# Patient Record
Sex: Male | Born: 1975 | Race: White | Hispanic: No | Smoking: Never smoker
Health system: Southern US, Community
[De-identification: ages and names within clinical notes are randomized; demographics above are authoritative.]

## PROBLEM LIST (undated history)

## (undated) DIAGNOSIS — F988 Other specified behavioral and emotional disorders with onset usually occurring in childhood and adolescence: Secondary | ICD-10-CM

## (undated) DIAGNOSIS — F419 Anxiety disorder, unspecified: Secondary | ICD-10-CM

## (undated) DIAGNOSIS — E559 Vitamin D deficiency, unspecified: Secondary | ICD-10-CM

## (undated) DIAGNOSIS — E069 Thyroiditis, unspecified: Secondary | ICD-10-CM

## (undated) DIAGNOSIS — E291 Testicular hypofunction: Secondary | ICD-10-CM

## (undated) DIAGNOSIS — I10 Essential (primary) hypertension: Secondary | ICD-10-CM

## (undated) HISTORY — DX: Anxiety disorder, unspecified: F41.9

## (undated) HISTORY — DX: Other specified behavioral and emotional disorders with onset usually occurring in childhood and adolescence: F98.8

## (undated) HISTORY — DX: Vitamin D deficiency, unspecified: E55.9

## (undated) HISTORY — DX: Testicular hypofunction: E29.1

## (undated) HISTORY — DX: Thyroiditis, unspecified: E06.9

## (undated) HISTORY — DX: Essential (primary) hypertension: I10

---

## 1998-05-25 ENCOUNTER — Emergency Department (HOSPITAL_COMMUNITY): Admission: EM | Admit: 1998-05-25 | Discharge: 1998-05-25 | Payer: Self-pay | Admitting: Emergency Medicine

## 2001-05-12 ENCOUNTER — Emergency Department (HOSPITAL_COMMUNITY): Admission: EM | Admit: 2001-05-12 | Discharge: 2001-05-12 | Payer: Self-pay | Admitting: Emergency Medicine

## 2009-07-19 ENCOUNTER — Encounter: Payer: Self-pay | Admitting: Family Medicine

## 2013-06-03 ENCOUNTER — Encounter: Payer: Self-pay | Admitting: Physician Assistant

## 2013-06-03 ENCOUNTER — Ambulatory Visit: Payer: Self-pay | Admitting: Emergency Medicine

## 2013-06-03 ENCOUNTER — Ambulatory Visit: Payer: Self-pay | Admitting: Physician Assistant

## 2013-06-03 VITALS — BP 130/80 | HR 60 | Temp 98.6°F | Resp 16 | Wt 253.0 lb

## 2013-06-03 DIAGNOSIS — F988 Other specified behavioral and emotional disorders with onset usually occurring in childhood and adolescence: Secondary | ICD-10-CM | POA: Insufficient documentation

## 2013-06-03 DIAGNOSIS — J019 Acute sinusitis, unspecified: Secondary | ICD-10-CM

## 2013-06-03 MED ORDER — AMPHETAMINE-DEXTROAMPHETAMINE 20 MG PO TABS: 20.0000 mg | ORAL_TABLET | Freq: Two times a day (BID) | ORAL | Status: DC

## 2013-06-03 MED ORDER — SULFAMETHOXAZOLE-TMP DS 800-160 MG PO TABS: 1.0000 | ORAL_TABLET | Freq: Two times a day (BID) | ORAL | Status: DC

## 2013-06-03 MED ORDER — PREDNISONE 5 MG PO TABS: 5.0000 mg | ORAL_TABLET | ORAL | Status: DC

## 2013-06-03 NOTE — Progress Notes (Signed)
HPI  Patient presents with URI symptoms of headache, malaise, congestion, post nasal drip, sore throat, sinus pain and dry cough  for 3 days.  Denies fever, chills, dyspnea, wheezing..  Patient has tried no OTC meds. Patient current smoker. 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 He often gets distracted with tasks and has a difficult time completing task. He has a history of ADD when he was a child and has tried some of his wife's addreall with success.   Past Medical History  Diagnosis Date  . Anxiety   . ADD (attention deficit disorder)   . Hypertension   . Hypogonadism male   . Vitamin D deficiency   . Thyroiditis    Current outpatient prescriptions:amphetamine-dextroamphetamine (ADDERALL) 20 MG tablet, Take 1 tablet (20 mg total) by mouth 2 (two) times daily., Disp: 60 tablet, Rfl: 0;  predniSONE (DELTASONE) 5 MG tablet, Take 1 tablet (5 mg total) by mouth as directed., Disp: 30 tablet, Rfl: 0;  sulfamethoxazole-trimethoprim (BACTRIM DS) 800-160 MG per tablet, Take 1 tablet by mouth 2 (two) times daily., Disp: 28 tablet, Rfl: 0  Allergies no known allergies  ROS- Review of systems negative expect for the pertinent information above.  Physical-  Filed Vitals:   06/03/13 1545  BP: 130/80  Pulse: 60  Temp: 98.6 F (37 C)  Resp: 16    General Appearance: Patient in no distress.  Eyes: PERRLA, EOMS, conjunctiva pink without drainage or nodules.  Sinuses: Tender maxillary and frontal sinus tenderness.  ENT/Mouth: External auditory canals clear, no swelling, erythema, drainage. TMs normal light reflex without erythema, bulging. Normal nasal mucosa and turbinates. Good denition. Post pharynx without erythematous, swelling or exudate. Tonsils without swelling, erythema or exudate.  Neck: Supple.  Respiratory: CTAB.  Carviovascular: RRR, no murmurs, rubs or gallops.  Assessment and plan  Problem List Items Addressed This Visit      Respiratory   Sinusitis, acute   Relevant Medications      predniSONE (DELTASONE) tablet      sulfamethoxazole-trimethoprim (BACTRIM DS) tablet 800-160 mg     Other   ADD (attention deficit disorder) - Primary     Adderall 20 mg 1 PO BID # 60 no refills given to patient.   Follow up in one month.   No orders of the defined types were placed in this encounter.

## 2013-07-02 ENCOUNTER — Telehealth: Payer: Self-pay | Admitting: Internal Medicine

## 2013-07-02 MED ORDER — AMPHETAMINE-DEXTROAMPHETAMINE 20 MG PO TABS: 20.0000 mg | ORAL_TABLET | Freq: Two times a day (BID) | ORAL | Status: DC

## 2013-07-02 NOTE — Telephone Encounter (Signed)
PT CALLING FOR REFILL ON ADDERALL 20MG  1-BID  NEXT APPT: 08-11-13 F/U WITH AMANDA COLLIER  PLEASE CALL WHEN READY.  [phone]

## 2013-07-02 NOTE — Addendum Note (Signed)
Addended by: Quentin Mulling R on: 07/02/2013 11:32 AM   Modules accepted: Orders

## 2013-08-04 ENCOUNTER — Other Ambulatory Visit: Payer: Self-pay | Admitting: Physician Assistant

## 2013-08-04 ENCOUNTER — Ambulatory Visit: Payer: Self-pay | Admitting: Physician Assistant

## 2013-08-04 MED ORDER — AMPHETAMINE-DEXTROAMPHETAMINE 20 MG PO TABS: 20.0000 mg | ORAL_TABLET | Freq: Two times a day (BID) | ORAL | Status: DC

## 2013-08-10 DIAGNOSIS — E291 Testicular hypofunction: Secondary | ICD-10-CM | POA: Insufficient documentation

## 2013-08-10 DIAGNOSIS — I10 Essential (primary) hypertension: Secondary | ICD-10-CM | POA: Insufficient documentation

## 2013-08-10 DIAGNOSIS — E559 Vitamin D deficiency, unspecified: Secondary | ICD-10-CM | POA: Insufficient documentation

## 2013-08-10 DIAGNOSIS — E069 Thyroiditis, unspecified: Secondary | ICD-10-CM | POA: Insufficient documentation

## 2013-08-10 DIAGNOSIS — F419 Anxiety disorder, unspecified: Secondary | ICD-10-CM | POA: Insufficient documentation

## 2013-08-11 ENCOUNTER — Ambulatory Visit: Payer: Self-pay | Admitting: Physician Assistant

## 2013-09-01 ENCOUNTER — Encounter: Payer: Self-pay | Admitting: Physician Assistant

## 2013-09-01 ENCOUNTER — Other Ambulatory Visit: Payer: Self-pay

## 2013-09-01 ENCOUNTER — Ambulatory Visit (INDEPENDENT_AMBULATORY_CARE_PROVIDER_SITE_OTHER): Payer: BC Managed Care – PPO | Admitting: Physician Assistant

## 2013-09-01 VITALS — BP 138/88 | HR 68 | Temp 98.1°F | Resp 16 | Ht 77.0 in | Wt 248.0 lb

## 2013-09-01 DIAGNOSIS — F419 Anxiety disorder, unspecified: Secondary | ICD-10-CM

## 2013-09-01 DIAGNOSIS — J209 Acute bronchitis, unspecified: Secondary | ICD-10-CM

## 2013-09-01 DIAGNOSIS — J36 Peritonsillar abscess: Secondary | ICD-10-CM

## 2013-09-01 DIAGNOSIS — E782 Mixed hyperlipidemia: Secondary | ICD-10-CM

## 2013-09-01 DIAGNOSIS — E069 Thyroiditis, unspecified: Secondary | ICD-10-CM

## 2013-09-01 DIAGNOSIS — E559 Vitamin D deficiency, unspecified: Secondary | ICD-10-CM

## 2013-09-01 DIAGNOSIS — I10 Essential (primary) hypertension: Secondary | ICD-10-CM

## 2013-09-01 DIAGNOSIS — Z79899 Other long term (current) drug therapy: Secondary | ICD-10-CM

## 2013-09-01 DIAGNOSIS — E785 Hyperlipidemia, unspecified: Secondary | ICD-10-CM

## 2013-09-01 DIAGNOSIS — E291 Testicular hypofunction: Secondary | ICD-10-CM

## 2013-09-01 LAB — CBC WITH DIFFERENTIAL/PLATELET
BASOS ABS: 0.1 10*3/uL (ref 0.0–0.1)
Basophils Relative: 0 % (ref 0–1)
Eosinophils Absolute: 0.2 10*3/uL (ref 0.0–0.7)
Eosinophils Relative: 1 % (ref 0–5)
HEMATOCRIT: 42.8 % (ref 39.0–52.0)
HEMOGLOBIN: 14.8 g/dL (ref 13.0–17.0)
LYMPHS PCT: 6 % — AB (ref 12–46)
Lymphs Abs: 1.4 10*3/uL (ref 0.7–4.0)
MCH: 31.1 pg (ref 26.0–34.0)
MCHC: 34.6 g/dL (ref 30.0–36.0)
MCV: 89.9 fL (ref 78.0–100.0)
MONO ABS: 1.7 10*3/uL — AB (ref 0.1–1.0)
MONOS PCT: 7 % (ref 3–12)
NEUTROS PCT: 86 % — AB (ref 43–77)
Neutro Abs: 19.3 10*3/uL — ABNORMAL HIGH (ref 1.7–7.7)
Platelets: 404 10*3/uL — ABNORMAL HIGH (ref 150–400)
RBC: 4.76 MIL/uL (ref 4.22–5.81)
RDW: 13.3 % (ref 11.5–15.5)
WBC: 22.7 10*3/uL — AB (ref 4.0–10.5)

## 2013-09-01 LAB — LIPID PANEL
CHOL/HDL RATIO: 3.3 ratio
Cholesterol: 170 mg/dL (ref 0–200)
HDL: 52 mg/dL (ref >39)
LDL CALC: 100 mg/dL — AB (ref 0–99)
TRIGLYCERIDES: 90 mg/dL (ref <150)
VLDL: 18 mg/dL (ref 0–40)

## 2013-09-01 LAB — BASIC METABOLIC PANEL WITH GFR
BUN: 8 mg/dL (ref 6–23)
CHLORIDE: 97 meq/L (ref 96–112)
CO2: 27 mEq/L (ref 19–32)
Calcium: 9.9 mg/dL (ref 8.4–10.5)
Creat: 0.87 mg/dL (ref 0.50–1.35)
GFR, Est African American: >89 mL/min
GFR, Est Non African American: >89 mL/min
Glucose, Bld: 91 mg/dL (ref 70–99)
Potassium: 4.6 mEq/L (ref 3.5–5.3)
SODIUM: 135 meq/L (ref 135–145)

## 2013-09-01 LAB — HEPATIC FUNCTION PANEL
ALK PHOS: 78 U/L (ref 39–117)
ALT: 18 U/L (ref 0–53)
AST: 19 U/L (ref 0–37)
Albumin: 5 g/dL (ref 3.5–5.2)
BILIRUBIN INDIRECT: 0.8 mg/dL (ref 0.2–1.2)
BILIRUBIN TOTAL: 1 mg/dL (ref 0.2–1.2)
Bilirubin, Direct: 0.2 mg/dL (ref 0.0–0.3)
TOTAL PROTEIN: 7.6 g/dL (ref 6.0–8.3)

## 2013-09-01 LAB — MAGNESIUM: MAGNESIUM: 1.7 mg/dL (ref 1.5–2.5)

## 2013-09-01 MED ORDER — CEFTRIAXONE SODIUM 1 G IJ SOLR
1.0000 g | Freq: Once | INTRAMUSCULAR | Status: AC
  Administered 2013-09-01: 1 g via INTRAMUSCULAR

## 2013-09-01 MED ORDER — HYDROCODONE-ACETAMINOPHEN 5-325 MG PO TABS: 1.0000 | ORAL_TABLET | Freq: Four times a day (QID) | ORAL | Status: DC | PRN

## 2013-09-01 MED ORDER — DEXAMETHASONE SODIUM PHOSPHATE 100 MG/10ML IJ SOLN
10.0000 mg | Freq: Once | INTRAMUSCULAR | Status: AC
  Administered 2013-09-01: 10 mg via INTRAMUSCULAR

## 2013-09-01 MED ORDER — AMPHETAMINE-DEXTROAMPHETAMINE 20 MG PO TABS: 20.0000 mg | ORAL_TABLET | Freq: Two times a day (BID) | ORAL | Status: DC

## 2013-09-01 MED ORDER — AMOXICILLIN-POT CLAVULANATE 875-125 MG PO TABS: 1.0000 | ORAL_TABLET | Freq: Two times a day (BID) | ORAL | Status: AC

## 2013-09-01 MED ORDER — CEFTRIAXONE SODIUM 500 MG IJ SOLR: 500.0000 mg | Freq: Once | INTRAMUSCULAR | Status: DC

## 2013-09-01 NOTE — Patient Instructions (Signed)
Peritonsillar Abscess A peritonsillar abscess is a collection of pus located in the back of the throat behind the tonsils. It usually occurs when a streptococcal infection of the throat or tonsils spreads into the space around the tonsils. They are almost always caused by the streptococcal germ (bacteria). The treatment of a peritonsillar abscess is most often drainage accomplished by putting a needle into the abscess or cutting (incising) and draining the abscess. This is most often followed with a course of antibiotics. HOME CARE INSTRUCTIONS  If your abscess was drained by your caregiver today, rinse your throat (gargle) with warm salt water four times per day or as needed for comfort. Do not swallow this mixture. Mix 1 teaspoon of salt in 8 ounces of warm water for gargling.  Rest in bed as needed. Resume activities as able.  Apply cold to your neck for pain relief. Fill a plastic bag with ice and wrap it in a towel. Hold the ice on your neck for 20 minutes 4 times per day.  Eat a soft or liquid diet as tolerated while your throat remains sore. Popsicles and ice cream may be good early choices. Drinking plenty of cold fluids will probably be soothing and help take swelling down in between the warm gargles.  Only take over-the-counter or prescription medicines for pain, discomfort, or fever as directed by your caregiver. Do not use aspirin unless directed by your physician. Aspirin slows down the clotting process. It can also cause bleeding from the drainage area if this was needled or incised today.  If antibiotics were prescribed, take them as directed for the full course of the prescription. Even if you feel you are well, you need to take them. SEEK MEDICAL CARE IF:   You have increased pain, swelling, redness, or drainage in your throat.  You develop signs of infection such as dizziness, headache, lethargy, or generalized feelings of illness.  You have difficulty breathing, swallowing or  eating.  You show signs of becoming dehydrated (lightheadedness when standing, decreased urine output, a fast heart rate, or dry mouth and mucous membranes). SEEK IMMEDIATE MEDICAL CARE IF:   You have a fever.  You are coughing up or vomiting blood.  You develop more severe throat pain uncontrolled with medicines or you start to drool.  You develop difficulty breathing, talking, or find it easier to breathe while leaning forward. Document Released: 07/16/2005 Document Revised: 10/08/2011 Document Reviewed: 02/27/2008 [employer] Patient Information 2014 [employer].  

## 2013-09-01 NOTE — Progress Notes (Signed)
HPI Patient presents for 3 month follow up with hypertension, hyperlipidemia, prediabetes and vitamin D.  Patient's blood pressure has been controlled at home, today their BP is BP: 138/88 mmHg  Patient denies chest pain, shortness of breath, dizziness.   Patient's cholesterol is diet controlled. His cholesterol is at goal.   Patient is on Vitamin D supplement.    He states that he is doing better at work with his Adderall, no AEs, no palpations, anxiety, or trouble sleeping.   Patient has had chest congestion for 1 week, then this morning left sided neck pain, throat pain, trouble swallowing, denies fever/chills.   Current Medications:  Current Outpatient Prescriptions on File Prior to Visit  Medication Sig Dispense Refill  . amphetamine-dextroamphetamine (ADDERALL) 20 MG tablet Take 1 tablet (20 mg total) by mouth 2 (two) times daily.  60 tablet  0   No current facility-administered medications on file prior to visit.   Medical History:  Past Medical History  Diagnosis Date  . ADD (attention deficit disorder)   . Hypertension   . Hypogonadism male   . Vitamin D deficiency   . Anxiety   . Thyroiditis    Allergies: No Known Allergies  ROS Constitutional: Denies fever, chills, headaches, insomnia, fatigue, night sweats Eyes: Denies redness, blurred vision, diplopia, discharge, itchy, watery eyes.  ENT: + trouble swallowing, sore throat Denies congestion, post nasal drip,  earache, dental pain, Tinnitus, Vertigo, Sinus pain, snoring.  Cardio: Denies chest pain, palpitations, irregular heartbeat, dyspnea, diaphoresis, orthopnea, PND, claudication, edema Respiratory: + cough, wheezing  Denies shortness of breath Gastrointestinal: Denies dysphagia, heartburn, AB pain/ cramps, N/V, diarrhea, constipation, hematemesis, melena, hematochezia,  hemorrhoids Genitourinary: Denies dysuria, frequency, urgency, nocturia, hesitancy, discharge, hematuria, flank pain Musculoskeletal: Denies  myalgia, stiffness, pain, swelling and strain/sprain. Skin: Denies pruritis, rash, changing in skin lesion Neuro: Denies Weakness, tremor, incoordination, spasms, pain Psychiatric: Denies confusion, memory loss, sensory loss Endocrine: Denies change in weight, skin, hair change, nocturia Diabetic Polys, Denies visual blurring, hyper /hypo glycemic episodes, and paresthesia, Heme/Lymph: Denies Excessive bleeding, bruising, enlarged lymph nodes  Family history- Review and unchanged Social history- Review and unchanged Physical Exam: Filed Vitals:   09/01/13 1638  BP: 138/88  Pulse: 68  Temp: 98.1 F (36.7 C)  Resp: 16   Filed Weights   09/01/13 1638  Weight: 248 lb (112.492 kg)   General Appearance: Well nourished, in no apparent distress. Eyes: PERRLA, EOMs, conjunctiva no swelling or erythema Sinuses: No Frontal/maxillary tenderness ENT/Mouth: Ext aud canals clear, TMs without erythema, bulging. Left post tonsil with swelling, erythematous, no exudate, slight shift of uvula to the right Hearing normal.  Neck: Supple, thyroid normal.  Respiratory: Respiratory effort normal, with diffuse wheezing without rales, rhonchi, or stridor.  Cardio: RRR with no MRGs. Brisk peripheral pulses without edema.  Abdomen: Soft, + BS.  Non tender, no guarding, rebound, hernias, masses. Lymphatics: + left anterior lymphadenopathy  Musculoskeletal: Full ROM, 5/5 strength, normal gait.  Skin: Warm, dry without rashes, lesions, ecchymosis.  Neuro: Cranial nerves intact. Normal muscle tone, no cerebellar symptoms. Sensation intact.  Psych: Awake and oriented X 3, normal affect, Insight and Judgment appropriate.   Assessment and Plan:  Hypertension: Continue medication, monitor blood pressure at home.  Continue DASH diet. Cholesterol: Continue diet and exercise. Check cholesterol.  Vitamin D Def- check level and continue medications.  ? Peritonsilar absess/wheezing- rochepin, prednisone injection-  Lateral soft tissue views of nasopharynx and oropharynx/CXR- Augmentin 875 BID for 10 days. If worse go  to ER.  ADD- Adderall 20 #60 NR  Continue diet and meds as discussed. Further disposition pending results of labs. OVER 40 minutes of exam, counseling, chart review, referral performed   Quentin Mulling 5:01 PM

## 2013-09-02 ENCOUNTER — Ambulatory Visit (HOSPITAL_COMMUNITY)
Admission: RE | Admit: 2013-09-02 | Discharge: 2013-09-02 | Disposition: A | Discharge status: Home / Self Care | Payer: BC Managed Care – PPO | Source: Ambulatory Visit | Attending: Physician Assistant | Admitting: Physician Assistant

## 2013-09-02 DIAGNOSIS — R0989 Other specified symptoms and signs involving the circulatory and respiratory systems: Secondary | ICD-10-CM | POA: Insufficient documentation

## 2013-09-02 DIAGNOSIS — R059 Cough, unspecified: Secondary | ICD-10-CM | POA: Insufficient documentation

## 2013-09-02 DIAGNOSIS — J209 Acute bronchitis, unspecified: Secondary | ICD-10-CM

## 2013-09-02 DIAGNOSIS — J36 Peritonsillar abscess: Secondary | ICD-10-CM | POA: Insufficient documentation

## 2013-09-02 DIAGNOSIS — R05 Cough: Secondary | ICD-10-CM | POA: Insufficient documentation

## 2013-09-02 LAB — VITAMIN D 25 HYDROXY (VIT D DEFICIENCY, FRACTURES): VIT D 25 HYDROXY: 38 ng/mL (ref 30–89)

## 2013-09-02 LAB — TSH: TSH: 0.908 u[IU]/mL (ref 0.350–4.500)

## 2013-09-02 NOTE — Addendum Note (Signed)
Addended by: Quentin MullingOLLIER, Margrett Kalb R on: 09/02/2013 11:04 AM   Modules accepted: Orders

## 2013-09-04 ENCOUNTER — Ambulatory Visit (INDEPENDENT_AMBULATORY_CARE_PROVIDER_SITE_OTHER): Payer: BC Managed Care – PPO | Admitting: Physician Assistant

## 2013-09-04 ENCOUNTER — Encounter (HOSPITAL_COMMUNITY): Payer: Self-pay | Admitting: Emergency Medicine

## 2013-09-04 ENCOUNTER — Emergency Department (HOSPITAL_COMMUNITY)
Admission: EM | Admit: 2013-09-04 | Discharge: 2013-09-04 | Disposition: A | Discharge status: Home / Self Care | Payer: BC Managed Care – PPO | Attending: Emergency Medicine | Admitting: Emergency Medicine

## 2013-09-04 ENCOUNTER — Emergency Department (HOSPITAL_COMMUNITY): Payer: BC Managed Care – PPO

## 2013-09-04 DIAGNOSIS — J36 Peritonsillar abscess: Secondary | ICD-10-CM | POA: Insufficient documentation

## 2013-09-04 DIAGNOSIS — Z79899 Other long term (current) drug therapy: Secondary | ICD-10-CM | POA: Insufficient documentation

## 2013-09-04 DIAGNOSIS — R509 Fever, unspecified: Secondary | ICD-10-CM | POA: Insufficient documentation

## 2013-09-04 DIAGNOSIS — Z862 Personal history of diseases of the blood and blood-forming organs and certain disorders involving the immune mechanism: Secondary | ICD-10-CM | POA: Insufficient documentation

## 2013-09-04 DIAGNOSIS — Z792 Long term (current) use of antibiotics: Secondary | ICD-10-CM | POA: Insufficient documentation

## 2013-09-04 DIAGNOSIS — F988 Other specified behavioral and emotional disorders with onset usually occurring in childhood and adolescence: Secondary | ICD-10-CM | POA: Insufficient documentation

## 2013-09-04 DIAGNOSIS — I10 Essential (primary) hypertension: Secondary | ICD-10-CM | POA: Insufficient documentation

## 2013-09-04 DIAGNOSIS — F17200 Nicotine dependence, unspecified, uncomplicated: Secondary | ICD-10-CM | POA: Insufficient documentation

## 2013-09-04 DIAGNOSIS — F411 Generalized anxiety disorder: Secondary | ICD-10-CM | POA: Insufficient documentation

## 2013-09-04 DIAGNOSIS — Z8639 Personal history of other endocrine, nutritional and metabolic disease: Secondary | ICD-10-CM | POA: Insufficient documentation

## 2013-09-04 LAB — BASIC METABOLIC PANEL
BUN: 10 mg/dL (ref 6–23)
CHLORIDE: 97 meq/L (ref 96–112)
CO2: 28 mEq/L (ref 19–32)
CREATININE: 0.98 mg/dL (ref 0.50–1.35)
Calcium: 9.4 mg/dL (ref 8.4–10.5)
GFR calc Af Amer: >90 mL/min (ref >90)
GFR calc non Af Amer: >90 mL/min (ref >90)
Glucose, Bld: 107 mg/dL — ABNORMAL HIGH (ref 70–99)
Potassium: 4.3 mEq/L (ref 3.7–5.3)
Sodium: 138 mEq/L (ref 137–147)

## 2013-09-04 LAB — CBC WITH DIFFERENTIAL/PLATELET
Basophils Absolute: 0.1 K/uL (ref 0.0–0.1)
Basophils Relative: 0 % (ref 0–1)
Eosinophils Absolute: 0.3 K/uL (ref 0.0–0.7)
Eosinophils Relative: 1 % (ref 0–5)
HCT: 47 % (ref 39.0–52.0)
Hemoglobin: 16.4 g/dL (ref 13.0–17.0)
Lymphocytes Relative: 9 % — ABNORMAL LOW (ref 12–46)
Lymphs Abs: 2 K/uL (ref 0.7–4.0)
MCH: 32.4 pg (ref 26.0–34.0)
MCHC: 34.9 g/dL (ref 30.0–36.0)
MCV: 92.9 fL (ref 78.0–100.0)
Monocytes Absolute: 2.1 K/uL — ABNORMAL HIGH (ref 0.1–1.0)
Monocytes Relative: 9 % (ref 3–12)
Neutro Abs: 18.4 K/uL — ABNORMAL HIGH (ref 1.7–7.7)
Neutrophils Relative %: 80 % — ABNORMAL HIGH (ref 43–77)
Platelets: 359 K/uL (ref 150–400)
RBC: 5.06 MIL/uL (ref 4.22–5.81)
RDW: 13 % (ref 11.5–15.5)
WBC: 22.8 K/uL — ABNORMAL HIGH (ref 4.0–10.5)

## 2013-09-04 MED ORDER — SODIUM CHLORIDE 0.9 % IV BOLUS (SEPSIS)
1000.0000 mL | Freq: Once | INTRAVENOUS | Status: AC
  Administered 2013-09-04: 1000 mL via INTRAVENOUS

## 2013-09-04 MED ORDER — MORPHINE SULFATE 4 MG/ML IJ SOLN
4.0000 mg | Freq: Once | INTRAMUSCULAR | Status: AC
  Administered 2013-09-04: 4 mg via INTRAVENOUS
  Filled 2013-09-04: qty 1

## 2013-09-04 MED ORDER — METHYLPREDNISOLONE SODIUM SUCC 125 MG IJ SOLR
125.0000 mg | Freq: Once | INTRAMUSCULAR | Status: AC
  Administered 2013-09-04: 125 mg via INTRAVENOUS
  Filled 2013-09-04: qty 2

## 2013-09-04 NOTE — Progress Notes (Signed)
Patient was seen on 09/01/2012 for sore throat and cough. Appeared to be possible left peritonsillar abscess. Patient was in no distress at that time so he was given Prednisone shot, Rocephin shot, and Augmentin 875 BID. We have kept in close contact on the phone and he has denied worsening swallowing or respiratory symptoms. Still able to take pills and PO. Saw him today in the office for an evaluation before the weekend. States some worsening swallowing this morning, wife is with him and she is concerned. States he turns his head to the right to swallow which is new. He does continue to smoke.  Lab Results  Component Value Date   WBC 22.7* 09/01/2013   HGB 14.8 09/01/2013   HCT 42.8 09/01/2013   MCV 89.9 09/01/2013   PLT 404* 09/01/2013   Current Outpatient Prescriptions on File Prior to Visit  Medication Sig Dispense Refill  . amoxicillin-clavulanate (AUGMENTIN) 875-125 MG per tablet Take 1 tablet by mouth 2 (two) times daily.  28 tablet  0  . amphetamine-dextroamphetamine (ADDERALL) 20 MG tablet Take 1 tablet (20 mg total) by mouth 2 (two) times daily.  60 tablet  0  . HYDROcodone-acetaminophen (NORCO) 5-325 MG per tablet Take 1 tablet by mouth every 6 (six) hours as needed for moderate pain.  30 tablet  0   No current facility-administered medications on file prior to visit.   No Known Allergies Past Medical History  Diagnosis Date  . ADD (attention deficit disorder)   . Hypertension   . Hypogonadism male   . Vitamin D deficiency   . Anxiety   . Thyroiditis    ROS- see above  Physical  General Appearance: Well nourished, in no apparent distress. Eyes: PERRLA, EOMs, conjunctiva no swelling or erythema Sinuses: No Frontal/maxillary tenderness ENT/Mouth: Ext aud canals clear, TMs without erythema, bulging. Left posterior tonsil with swelling worsening swelling, erythematous, no exudate, worsening shift of uvula to the right .  Neck: Supple, thyroid normal.  Respiratory: Respiratory effort  normal, with diffuse wheezing without rales, rhonchi, or stridor.  Cardio: RRR with no MRGs. Brisk peripheral pulses without edema.  Abdomen: Soft, + BS.  Non tender, no guarding, rebound, hernias, masses. Lymphatics: + left anterior lymphadenopathy  Musculoskeletal: Full ROM, 5/5 strength, normal gait.  Skin: Warm, dry without rashes, lesions, ecchymosis.  Neuro: Cranial nerves intact. Normal muscle tone, no cerebellar symptoms. Sensation intact.  Psych: Awake and oriented X 3, normal affect, Insight and Judgment appropriate.   PLAN and ASSESSMENT Worsening peritonsilar abscess- failure of out patient therapy- wife will take him to the ER ASAP for possible I&D and IV ABX

## 2013-09-04 NOTE — ED Provider Notes (Signed)
Medical screening examination/treatment/procedure(s) were performed by non-physician practitioner and as supervising physician I was immediately available for consultation/collaboration.  EKG Interpretation   None         Guy Seese N Deo Mehringer, DO 09/04/13 1452 

## 2013-09-04 NOTE — ED Notes (Signed)
Pt in stating he has an abscess to the left side of his throat, dx by PMD, started on antibiotics two days ago but swelling has increased, respirations even and nonlabored

## 2013-09-04 NOTE — ED Provider Notes (Signed)
CSN: [id number]     Arrival date & time 09/04/13  1137 History   First MD Initiated Contact with Patient 09/04/13 1145     Chief Complaint  Patient presents with  . Throat abscess    (Consider location/radiation/quality/duration/timing/severity/associated sxs/prior Treatment) HPI Comments: Patient presents to the emergency department with chief complaint of peritonsillar abscess. He states that he was seen 2 days ago by his PCP. He states that the inflammation started 4 days ago. He was given Augmentin by his PCP, and told to followup in the ED if the symptoms worsened. He states that the abscess was enlarged, and become more painful. He endorses subjective fevers and chills, but has not recorded a temperature. He endorses pain with swallowing, but denies any difficulty with breathing. Additionally, his PCP diagnosed him with viral pneumonia.  The history is provided by the patient. No language interpreter was used.    Past Medical History  Diagnosis Date  . ADD (attention deficit disorder)   . Hypertension   . Hypogonadism male   . Vitamin D deficiency   . Anxiety   . Thyroiditis    History reviewed. No pertinent past surgical history. History reviewed. No pertinent family history. History  Substance Use Topics  . Smoking status: Current Some Day Smoker    Types: Cigarettes    Start date: 06/03/2010  . Smokeless tobacco: Current User    Types: Chew  . Alcohol Use: 3.0 oz/week    5 Cans of beer per week    Review of Systems  All other systems reviewed and are negative.    Allergies  Review of patient's allergies indicates no known allergies.  Home Medications   Current Outpatient Rx  Name  Route  Sig  Dispense  Refill  . ALPRAZolam (XANAX) 1 MG tablet   Oral   Take 1 mg by mouth 2 (two) times daily as needed for anxiety or sleep.         Marland Kitchen amoxicillin-clavulanate (AUGMENTIN) 875-125 MG per tablet   Oral   Take 1 tablet by mouth 2 (two) times daily.   28 tablet    0   . amphetamine-dextroamphetamine (ADDERALL) 20 MG tablet   Oral   Take 1 tablet (20 mg total) by mouth 2 (two) times daily.   60 tablet   0   . HYDROcodone-acetaminophen (NORCO) 5-325 MG per tablet   Oral   Take 1 tablet by mouth every 6 (six) hours as needed for moderate pain.   30 tablet   0    BP 155/108  Pulse 67  Temp(Src) 98 F (36.7 C) (Oral)  Resp 20  Ht 6\' 4"  (1.93 m)  Wt 245 lb (111.131 kg)  BMI 29.83 kg/m2  SpO2 97% Physical Exam  Nursing note and vitals reviewed. Constitutional: He is oriented to person, place, and time. He appears well-developed and well-nourished.  HENT:  Head: Normocephalic and atraumatic.  Left-sided peritonsillar abscess, with deviation of the uvula to the right, airway is intact  Eyes: Conjunctivae and EOM are normal. Pupils are equal, round, and reactive to light. Right eye exhibits no discharge. Left eye exhibits no discharge. No scleral icterus.  Neck: Normal range of motion. Neck supple. No JVD present.  No stridor or muffled voice  Cardiovascular: Normal rate, regular rhythm and normal heart sounds.  Exam reveals no gallop and no friction rub.   No murmur heard. Pulmonary/Chest: Effort normal and breath sounds normal. No respiratory distress. He has no wheezes. He has no  rales. He exhibits no tenderness.  Bilateral crackles  Abdominal: Soft. He exhibits no distension and no mass. There is no tenderness. There is no rebound and no guarding.  Musculoskeletal: Normal range of motion. He exhibits no edema and no tenderness.  Neurological: He is alert and oriented to person, place, and time.  Skin: Skin is warm and dry.  Psychiatric: He has a normal mood and affect. His behavior is normal. Judgment and thought content normal.    ED Course  Procedures (including critical care time) Labs Review Labs Reviewed  CBC WITH DIFFERENTIAL  BASIC METABOLIC PANEL   Imaging Review No results found.  EKG Interpretation   None        MDM   1. Peritonsillar abscess     Patient with peritonsillar abscess. I discussed the patient with Dr. Pollyann Kennedyosen, from ENT, who recommends the patient be discharged to his clinic. He will see the patient today.  CXR as above. Discussed the new right lower lobe atelectasis with Dr. Elesa MassedWard.  No opacity or infiltrate.  Will not treat with abx for atelectasis. Oxygenating well.  97% on room air.  PCP follow-up.  WBC is 22.8.  There is no airway compromise, no stridor.  Patient given some solumedrol.  Will discharge to ENT.  Patient is to report to Dr. Lucky Rathkeosen's office immediately.  Dr. Elesa MassedWard agrees with this plan.    Roxy Horsemanobert Kayvion Arneson, PA-C 09/04/13 1255

## 2013-09-04 NOTE — Discharge Instructions (Signed)
Peritonsillar Abscess A peritonsillar abscess is a collection of pus located in the back of the throat behind the tonsils. It usually occurs when a streptococcal infection of the throat or tonsils spreads into the space around the tonsils. They are almost always caused by the streptococcal germ (bacteria). The treatment of a peritonsillar abscess is most often drainage accomplished by putting a needle into the abscess or cutting (incising) and draining the abscess. This is most often followed with a course of antibiotics. HOME CARE INSTRUCTIONS  If your abscess was drained by your caregiver today, rinse your throat (gargle) with warm salt water four times per day or as needed for comfort. Do not swallow this mixture. Mix 1 teaspoon of salt in 8 ounces of warm water for gargling.  Rest in bed as needed. Resume activities as able.  Apply cold to your neck for pain relief. Fill a plastic bag with ice and wrap it in a towel. Hold the ice on your neck for 20 minutes 4 times per day.  Eat a soft or liquid diet as tolerated while your throat remains sore. Popsicles and ice cream may be good early choices. Drinking plenty of cold fluids will probably be soothing and help take swelling down in between the warm gargles.  Only take over-the-counter or prescription medicines for pain, discomfort, or fever as directed by your caregiver. Do not use aspirin unless directed by your physician. Aspirin slows down the clotting process. It can also cause bleeding from the drainage area if this was needled or incised today.  If antibiotics were prescribed, take them as directed for the full course of the prescription. Even if you feel you are well, you need to take them. SEEK MEDICAL CARE IF:   You have increased pain, swelling, redness, or drainage in your throat.  You develop signs of infection such as dizziness, headache, lethargy, or generalized feelings of illness.  You have difficulty breathing, swallowing or  eating.  You show signs of becoming dehydrated (lightheadedness when standing, decreased urine output, a fast heart rate, or dry mouth and mucous membranes). SEEK IMMEDIATE MEDICAL CARE IF:   You have a fever.  You are coughing up or vomiting blood.  You develop more severe throat pain uncontrolled with medicines or you start to drool.  You develop difficulty breathing, talking, or find it easier to breathe while leaning forward. Document Released: 07/16/2005 Document Revised: 10/08/2011 Document Reviewed: 02/27/2008 [employer] Patient Information 2014 [employer].  

## 2013-10-06 ENCOUNTER — Other Ambulatory Visit: Payer: Self-pay | Admitting: Internal Medicine

## 2013-10-06 MED ORDER — AMPHETAMINE-DEXTROAMPHETAMINE 20 MG PO TABS: 20.0000 mg | ORAL_TABLET | Freq: Two times a day (BID) | ORAL | Status: DC

## 2013-10-29 ENCOUNTER — Other Ambulatory Visit: Payer: Self-pay | Admitting: Physician Assistant

## 2013-10-29 MED ORDER — AMPHETAMINE-DEXTROAMPHETAMINE 20 MG PO TABS: 20.0000 mg | ORAL_TABLET | Freq: Two times a day (BID) | ORAL | Status: DC

## 2013-12-01 ENCOUNTER — Encounter: Payer: Self-pay | Admitting: Internal Medicine

## 2013-12-01 ENCOUNTER — Ambulatory Visit (INDEPENDENT_AMBULATORY_CARE_PROVIDER_SITE_OTHER): Payer: BC Managed Care – PPO | Admitting: Internal Medicine

## 2013-12-01 VITALS — BP 132/84 | HR 64 | Temp 98.1°F | Resp 16 | Ht 77.0 in | Wt 241.8 lb

## 2013-12-01 DIAGNOSIS — E291 Testicular hypofunction: Secondary | ICD-10-CM

## 2013-12-01 DIAGNOSIS — R7309 Other abnormal glucose: Secondary | ICD-10-CM

## 2013-12-01 DIAGNOSIS — Z79899 Other long term (current) drug therapy: Secondary | ICD-10-CM | POA: Insufficient documentation

## 2013-12-01 DIAGNOSIS — I10 Essential (primary) hypertension: Secondary | ICD-10-CM

## 2013-12-01 DIAGNOSIS — E782 Mixed hyperlipidemia: Secondary | ICD-10-CM

## 2013-12-01 DIAGNOSIS — E559 Vitamin D deficiency, unspecified: Secondary | ICD-10-CM

## 2013-12-01 MED ORDER — AMPHETAMINE-DEXTROAMPHETAMINE 20 MG PO TABS: ORAL_TABLET | ORAL | Status: DC

## 2013-12-01 NOTE — Progress Notes (Signed)
Patient ID: Cameron Donovan, male   DOB: 1975/08/25, 38 y.o.   MRN: [id number]    This very nice 38 y.o. MWM presents for 3 month follow up with Labile Hypertension, Hyperlipidemia, Pre-Diabetes and Vitamin D Deficiency. Patient is also dx'd with ADD with Sx's of decreased focusing and concentration improved on Adderall.   Labile HTN predates since 2005. BP is monitored expectantly and controlled with today's BP: 132/84 mmHg. Patient denies any cardiac type chest pain, palpitations, dyspnea/orthopnea/PND, dizziness, claudication, or dependent edema.   Hyperlipidemia is controlled with diet. In 2012 he had an elevated LDL Chol of 129. Last  Lipid Profile as below is at goal.Patient denies myalgias or other med SE's.  Lab Results  Component Value Date   CHOL 170 09/01/2013   HDL 52 09/01/2013   LDLCALC 528100* 09/01/2013   TRIG 90 09/01/2013   CHOLHDL 3.3 09/01/2013    Also, the patient has history of PreDiabetes with A1c 5.7% in 2012. Patient denies any symptoms of reactive hypoglycemia, diabetic polys, paresthesias or visual blurring.   Patient has Hx/o Low Testosterone several years ago which normalized on recheck. Further, Patient has history of Vitamin D Deficiency with last vitamin D of 45 in 2012. Patient supplements vitamin D without any suspected side-effects.    Medication List       amphetamine-dextroamphetamine 20 MG tablet  Commonly known as:  ADDERALL  Take 1/2 to 1 tablet 2 or 3 x daily as needed for ADD symptoms        No Known Allergies  PMHx:   Past Medical History  Diagnosis Date  . ADD (attention deficit disorder)   . Hypertension   . Hypogonadism male   . Vitamin D deficiency   . Anxiety   . Thyroiditis    FHx:    Reviewed / unchanged  SHx:    Reviewed / unchanged   Systems Review: Constitutional: Denies fever, chills, wt changes, headaches, insomnia, fatigue, night sweats, change in appetite. Eyes: Denies redness, blurred vision, diplopia, discharge, itchy, watery  eyes.  ENT: Denies discharge, congestion, post nasal drip, epistaxis, sore throat, earache, hearing loss, dental pain, tinnitus, vertigo, sinus pain, snoring.  CV: Denies chest pain, palpitations, irregular heartbeat, syncope, dyspnea, diaphoresis, orthopnea, PND, claudication or edema. Respiratory: denies cough, dyspnea, DOE, pleurisy, hoarseness, laryngitis, wheezing.  Gastrointestinal: Denies dysphagia, odynophagia, heartburn, reflux, water brash, abdominal pain or cramps, nausea, vomiting, bloating, diarrhea, constipation, hematemesis, melena, hematochezia  or hemorrhoids. Genitourinary: Denies dysuria, frequency, urgency, nocturia, hesitancy, discharge, hematuria or flank pain. Musculoskeletal: Denies arthralgias, myalgias, stiffness, jt. swelling, pain, limping or strain/sprain.  Skin: Denies pruritus, rash, hives, warts, acne, eczema or change in skin lesion(s). Neuro: No weakness, tremor, incoordination, spasms, paresthesia or pain. Psychiatric: Denies confusion, memory loss or sensory loss. Endo: Denies change in weight, skin or hair change.  Heme/Lymph: No excessive bleeding, bruising or enlarged lymph nodes.  Exam:  BP 132/84  Pulse 64  Temp 98.1 F   Resp 16  Ht 6\' 5"    Wt 241 lb 12.8 oz   BMI 28.67 kg/m2  Appears well nourished - in no distress. Eyes: PERRLA, EOMs, conjunctiva no swelling or erythema. Sinuses: No frontal/maxillary tenderness ENT/Mouth: EAC's clear, TM's nl w/o erythema, bulging. Nares clear w/o erythema, swelling, exudates. Oropharynx clear without erythema or exudates. Oral hygiene is good. Tongue normal, non obstructing. Hearing intact.  Neck: Supple. Thyroid nl. Car 2+/2+ without bruits, nodes or JVD. Chest: Respirations nl with BS clear & equal w/o rales, rhonchi, wheezing or  stridor.  Cor: Heart sounds normal w/ regular rate and rhythm without sig. murmurs, gallops, clicks, or rubs. Peripheral pulses normal and equal  without edema.  Abdomen: Soft &  bowel sounds normal. Non-tender w/o guarding, rebound, hernias, masses, or organomegaly.  Lymphatics: Unremarkable.  Musculoskeletal: Full ROM all peripheral extremities, joint stability, 5/5 strength, and normal gait.  Skin: Warm, dry without exposed rashes, lesions or ecchymosis apparent.  Neuro: Cranial nerves intact, reflexes equal bilaterally. Sensory-motor testing grossly intact. Tendon reflexes grossly intact.  Pysch: Alert & oriented x 3. Insight and judgement nl & appropriate. No ideations.  Assessment and Plan:  1. Labile Hypertension - Continue monitor blood pressure at home.   2. Hyperlipidemia - Continue diet, exercise,& lifestyle modifications. Continue monitor periodic cholesterol/liver & renal functions   3. Pre-diabetes - Continue diet, exercise, lifestyle modifications. Monitor appropriate labs.  4. Vitamin D Deficiency - Continue supplementation.  5. ADD  Recommended regular exercise, BP monitoring, weight control, and discussed med and SE's. Recommended labs to assess and monitor clinical status. Further disposition pending results of labs. ROV 6 mo. Recheck screening labs today.

## 2013-12-01 NOTE — Patient Instructions (Signed)

## 2013-12-02 LAB — CBC WITH DIFFERENTIAL/PLATELET
BASOS ABS: 0.1 10*3/uL (ref 0.0–0.1)
BASOS PCT: 1 % (ref 0–1)
Eosinophils Absolute: 0.2 10*3/uL (ref 0.0–0.7)
Eosinophils Relative: 2 % (ref 0–5)
HCT: 40.6 % (ref 39.0–52.0)
HEMOGLOBIN: 13.9 g/dL (ref 13.0–17.0)
Lymphocytes Relative: 18 % (ref 12–46)
Lymphs Abs: 1.9 10*3/uL (ref 0.7–4.0)
MCH: 30.8 pg (ref 26.0–34.0)
MCHC: 34.2 g/dL (ref 30.0–36.0)
MCV: 90 fL (ref 78.0–100.0)
MONOS PCT: 9 % (ref 3–12)
Monocytes Absolute: 1 10*3/uL (ref 0.1–1.0)
NEUTROS ABS: 7.4 10*3/uL (ref 1.7–7.7)
NEUTROS PCT: 70 % (ref 43–77)
PLATELETS: 365 10*3/uL (ref 150–400)
RBC: 4.51 MIL/uL (ref 4.22–5.81)
RDW: 13.4 % (ref 11.5–15.5)
WBC: 10.6 10*3/uL — ABNORMAL HIGH (ref 4.0–10.5)

## 2013-12-02 LAB — LIPID PANEL
CHOL/HDL RATIO: 3.2 ratio
CHOLESTEROL: 168 mg/dL (ref 0–200)
HDL: 52 mg/dL (ref >39)
LDL Cholesterol: 97 mg/dL (ref 0–99)
TRIGLYCERIDES: 94 mg/dL (ref <150)
VLDL: 19 mg/dL (ref 0–40)

## 2013-12-02 LAB — BASIC METABOLIC PANEL WITH GFR
BUN: 9 mg/dL (ref 6–23)
CO2: 28 mEq/L (ref 19–32)
Calcium: 9.9 mg/dL (ref 8.4–10.5)
Chloride: 102 mEq/L (ref 96–112)
Creat: 1.08 mg/dL (ref 0.50–1.35)
GFR, EST NON AFRICAN AMERICAN: 86 mL/min
Glucose, Bld: 86 mg/dL (ref 70–99)
Potassium: 4.3 mEq/L (ref 3.5–5.3)
SODIUM: 138 meq/L (ref 135–145)

## 2013-12-02 LAB — VITAMIN D 25 HYDROXY (VIT D DEFICIENCY, FRACTURES): VIT D 25 HYDROXY: 37 ng/mL (ref 30–89)

## 2013-12-02 LAB — INSULIN, FASTING: Insulin fasting, serum: 6 u[IU]/mL (ref 3–28)

## 2013-12-02 LAB — HEPATIC FUNCTION PANEL
ALT: 21 U/L (ref 0–53)
AST: 22 U/L (ref 0–37)
Albumin: 4.7 g/dL (ref 3.5–5.2)
Alkaline Phosphatase: 64 U/L (ref 39–117)
BILIRUBIN DIRECT: 0.2 mg/dL (ref 0.0–0.3)
BILIRUBIN INDIRECT: 0.5 mg/dL (ref 0.2–1.2)
Total Bilirubin: 0.7 mg/dL (ref 0.2–1.2)
Total Protein: 7.3 g/dL (ref 6.0–8.3)

## 2013-12-02 LAB — MAGNESIUM: MAGNESIUM: 2 mg/dL (ref 1.5–2.5)

## 2013-12-02 LAB — HEMOGLOBIN A1C
Hgb A1c MFr Bld: 5.4 % (ref <5.7)
MEAN PLASMA GLUCOSE: 108 mg/dL (ref <117)

## 2013-12-02 LAB — TESTOSTERONE: TESTOSTERONE: 359 ng/dL (ref 300–890)

## 2013-12-02 LAB — TSH: TSH: 0.86 u[IU]/mL (ref 0.350–4.500)

## 2014-01-11 ENCOUNTER — Other Ambulatory Visit: Payer: Self-pay | Admitting: Internal Medicine

## 2014-01-11 MED ORDER — AMPHETAMINE-DEXTROAMPHETAMINE 20 MG PO TABS: ORAL_TABLET | ORAL | Status: AC

## 2014-02-15 ENCOUNTER — Other Ambulatory Visit: Payer: Self-pay | Admitting: Internal Medicine

## 2014-02-15 MED ORDER — AMPHETAMINE-DEXTROAMPHETAMINE 20 MG PO TABS: ORAL_TABLET | ORAL | Status: DC

## 2014-03-18 ENCOUNTER — Other Ambulatory Visit: Payer: Self-pay | Admitting: *Deleted

## 2014-03-18 MED ORDER — AMPHETAMINE-DEXTROAMPHETAMINE 20 MG PO TABS: ORAL_TABLET | ORAL | Status: AC

## 2014-04-19 ENCOUNTER — Other Ambulatory Visit: Payer: Self-pay | Admitting: Internal Medicine

## 2014-04-19 DIAGNOSIS — F988 Other specified behavioral and emotional disorders with onset usually occurring in childhood and adolescence: Secondary | ICD-10-CM

## 2014-04-19 MED ORDER — AMPHETAMINE-DEXTROAMPHETAMINE 20 MG PO TABS: ORAL_TABLET | ORAL | Status: DC

## 2014-04-20 ENCOUNTER — Telehealth: Payer: Self-pay | Admitting: *Deleted

## 2014-04-20 ENCOUNTER — Other Ambulatory Visit: Payer: Self-pay | Admitting: Internal Medicine

## 2014-04-20 DIAGNOSIS — F988 Other specified behavioral and emotional disorders with onset usually occurring in childhood and adolescence: Secondary | ICD-10-CM

## 2014-04-20 MED ORDER — AMPHETAMINE-DEXTROAMPHETAMINE 20 MG PO TABS: ORAL_TABLET | ORAL | Status: DC

## 2014-04-20 NOTE — Telephone Encounter (Signed)
Patient advised RX ready for pick up.. 

## 2014-05-12 ENCOUNTER — Ambulatory Visit (INDEPENDENT_AMBULATORY_CARE_PROVIDER_SITE_OTHER): Payer: BC Managed Care – PPO | Admitting: Internal Medicine

## 2014-05-12 ENCOUNTER — Encounter: Payer: Self-pay | Admitting: Internal Medicine

## 2014-05-12 VITALS — BP 124/80 | HR 80 | Temp 98.2°F | Resp 16 | Ht 76.0 in | Wt 252.8 lb

## 2014-05-12 DIAGNOSIS — I10 Essential (primary) hypertension: Secondary | ICD-10-CM

## 2014-05-12 DIAGNOSIS — E782 Mixed hyperlipidemia: Secondary | ICD-10-CM

## 2014-05-12 DIAGNOSIS — F988 Other specified behavioral and emotional disorders with onset usually occurring in childhood and adolescence: Secondary | ICD-10-CM

## 2014-05-12 DIAGNOSIS — Z79899 Other long term (current) drug therapy: Secondary | ICD-10-CM

## 2014-05-12 DIAGNOSIS — R7309 Other abnormal glucose: Secondary | ICD-10-CM

## 2014-05-12 DIAGNOSIS — R7303 Prediabetes: Secondary | ICD-10-CM

## 2014-05-12 DIAGNOSIS — F909 Attention-deficit hyperactivity disorder, unspecified type: Secondary | ICD-10-CM

## 2014-05-12 DIAGNOSIS — E559 Vitamin D deficiency, unspecified: Secondary | ICD-10-CM

## 2014-05-12 LAB — CBC WITH DIFFERENTIAL/PLATELET
Basophils Absolute: 0.1 10*3/uL (ref 0.0–0.1)
Basophils Relative: 1 % (ref 0–1)
Eosinophils Absolute: 0.3 10*3/uL (ref 0.0–0.7)
Eosinophils Relative: 3 % (ref 0–5)
HCT: 42.3 % (ref 39.0–52.0)
Hemoglobin: 14.3 g/dL (ref 13.0–17.0)
Lymphocytes Relative: 22 % (ref 12–46)
Lymphs Abs: 2 10*3/uL (ref 0.7–4.0)
MCH: 31.2 pg (ref 26.0–34.0)
MCHC: 33.8 g/dL (ref 30.0–36.0)
MCV: 92.2 fL (ref 78.0–100.0)
Monocytes Absolute: 0.9 10*3/uL (ref 0.1–1.0)
Monocytes Relative: 10 % (ref 3–12)
Neutro Abs: 5.7 10*3/uL (ref 1.7–7.7)
Neutrophils Relative %: 64 % (ref 43–77)
Platelets: 342 10*3/uL (ref 150–400)
RBC: 4.59 MIL/uL (ref 4.22–5.81)
RDW: 13.4 % (ref 11.5–15.5)
WBC: 8.9 10*3/uL (ref 4.0–10.5)

## 2014-05-12 LAB — HEMOGLOBIN A1C
Hgb A1c MFr Bld: 5.3 % (ref <5.7)
Mean Plasma Glucose: 105 mg/dL (ref <117)

## 2014-05-12 MED ORDER — AMPHETAMINE-DEXTROAMPHETAMINE 20 MG PO TABS: ORAL_TABLET | ORAL | Status: DC

## 2014-05-12 NOTE — Progress Notes (Signed)
Patient ID: Cameron Donovan, male   DOB: [birth year], 38 y.o.   MRN: [id number]   This very nice 38 y.o.male presents for 3 month follow up with Labile Hypertension, Hyperlipidemia, Morbid Obesity and Vitamin D Deficiency. Patient also has ADD and reports greater ability to focus at work and increased productivity.    Patient is treated for HTN & BP has been controlled at home. Today's BP: 124/80 mmHg. Patient has had no complaints of any cardiac type chest pain, palpitations, dyspnea/orthopnea/PND, dizziness, claudication, or dependent edema.   Hyperlipidemia is controlled with diet. Patient denies myalgias or other med SE's. Last Lipids were at goal -  Total Cholesterol 168; HDL  52; LDL 97; Triglycerides 94 on 12/01/2013.   Also, the patient has Morbid Obesity (BMI 30+) and is screened for  PreDiabetes and has had no symptoms of reactive hypoglycemia, diabetic polys, paresthesias or visual blurring.  Last A1c was 5.4% on 12/01/2013.    Further, the patient also has history of Vitamin D Deficiency (39 in Oct 2013) and admits forgetting to take recommended  vitamin D. Last vitamin D was  37 on 12/01/2013.   Medication List   amphetamine-dextroamphetamine 20 MG tablet  Commonly known as:  ADDERALL  Take 1/2 to 1 tablet 1 to 2 x day if needed for ADD     No Known Allergies  PMHx:   Past Medical History  Diagnosis Date  . ADD (attention deficit disorder)   . Hypertension   . Hypogonadism male   . Vitamin D deficiency   . Anxiety   . Thyroiditis    No past surgical history on file.  FHx:    Reviewed / unchanged  SHx:    Reviewed / unchanged  Systems Review:  Constitutional: Denies fever, chills, wt changes, headaches, insomnia, fatigue, night sweats, change in appetite. Eyes: Denies redness, blurred vision, diplopia, discharge, itchy, watery eyes.  ENT: Denies discharge, congestion, post nasal drip, epistaxis, sore throat, earache, hearing loss, dental pain, tinnitus, vertigo, sinus pain,  snoring.  CV: Denies chest pain, palpitations, irregular heartbeat, syncope, dyspnea, diaphoresis, orthopnea, PND, claudication or edema. Respiratory: denies cough, dyspnea, DOE, pleurisy, hoarseness, laryngitis, wheezing.  Gastrointestinal: Denies dysphagia, odynophagia, heartburn, reflux, water brash, abdominal pain or cramps, nausea, vomiting, bloating, diarrhea, constipation, hematemesis, melena, hematochezia  or hemorrhoids. Genitourinary: Denies dysuria, frequency, urgency, nocturia, hesitancy, discharge, hematuria or flank pain. Musculoskeletal: Denies arthralgias, myalgias, stiffness, jt. swelling, pain, limping or strain/sprain.  Skin: Denies pruritus, rash, hives, warts, acne, eczema or change in skin lesion(s). Neuro: No weakness, tremor, incoordination, spasms, paresthesia or pain. Psychiatric: Denies confusion, memory loss or sensory loss. Endo: Denies change in weight, skin or hair change.  Heme/Lymph: No excessive bleeding, bruising or enlarged lymph nodes.  Exam:  BP 124/80  Pulse 80  Temp(Src) 98.2 F (36.8 C) (Temporal)  Resp 16  Ht 6\' 4"  (1.93 m)  Wt 252 lb 12.8 oz (114.669 kg)  BMI 30.78 kg/m2  Appears well nourished and in no distress. Eyes: PERRLA, EOMs, conjunctiva no swelling or erythema. Sinuses: No frontal/maxillary tenderness ENT/Mouth: EAC's clear, TM's nl w/o erythema, bulging. Nares clear w/o erythema, swelling, exudates. Oropharynx clear without erythema or exudates. Oral hygiene is good. Tongue normal, non obstructing. Hearing intact.  Neck: Supple. Thyroid nl. Car 2+/2+ without bruits, nodes or JVD. Chest: Respirations nl with BS clear & equal w/o rales, rhonchi, wheezing or stridor.  Cor: Heart sounds normal w/ regular rate and rhythm without sig. murmurs, gallops, clicks, or rubs. Peripheral pulses normal  and equal  without edema.  Abdomen: Soft & bowel sounds normal. Non-tender w/o guarding, rebound, hernias, masses, or organomegaly.  Lymphatics:  Unremarkable.  Musculoskeletal: Full ROM all peripheral extremities, joint stability, 5/5 strength, and normal gait.  Skin: Warm, dry without exposed rashes, lesions or ecchymosis apparent.  Neuro: Cranial nerves intact, reflexes equal bilaterally. Sensory-motor testing grossly intact. Tendon reflexes grossly intact.  Pysch: Alert & oriented x 3.  Insight and judgement nl & appropriate. No ideations.  Assessment and Plan:  1. Hypertension, Labile - Continue monitor blood pressure at home. Continue diet/meds same.  2. Hyperlipidemia - Continue diet/, exercise,& lifestyle modifications. Continue monitor periodic cholesterol/liver & renal functions   3. Pre-Diabetes - Continue diet, exercise, lifestyle modifications. Monitor appropriate labs.  4. Vitamin D Deficiency - Continue supplementation.  5. ADD -   Recommended regular exercise, BP monitoring, weight control, and discussed med and SE's. Recommended labs to assess and monitor clinical status. Further disposition pending results of labs.

## 2014-05-12 NOTE — Patient Instructions (Signed)

## 2014-05-13 LAB — BASIC METABOLIC PANEL WITH GFR
BUN: 13 mg/dL (ref 6–23)
CHLORIDE: 102 meq/L (ref 96–112)
CO2: 25 meq/L (ref 19–32)
CREATININE: 1.1 mg/dL (ref 0.50–1.35)
Calcium: 9.9 mg/dL (ref 8.4–10.5)
GFR, Est African American: >89 mL/min
GFR, Est Non African American: 84 mL/min
Glucose, Bld: 92 mg/dL (ref 70–99)
Potassium: 5 mEq/L (ref 3.5–5.3)
Sodium: 138 mEq/L (ref 135–145)

## 2014-05-13 LAB — HEPATIC FUNCTION PANEL
ALBUMIN: 4.9 g/dL (ref 3.5–5.2)
ALT: 27 U/L (ref 0–53)
AST: 25 U/L (ref 0–37)
Alkaline Phosphatase: 62 U/L (ref 39–117)
BILIRUBIN INDIRECT: 0.4 mg/dL (ref 0.2–1.2)
BILIRUBIN TOTAL: 0.5 mg/dL (ref 0.2–1.2)
Bilirubin, Direct: 0.1 mg/dL (ref 0.0–0.3)
Total Protein: 7.4 g/dL (ref 6.0–8.3)

## 2014-05-13 LAB — LIPID PANEL
Cholesterol: 179 mg/dL (ref 0–200)
HDL: 57 mg/dL (ref >39)
LDL CALC: 110 mg/dL — AB (ref 0–99)
Total CHOL/HDL Ratio: 3.1 Ratio
Triglycerides: 62 mg/dL (ref <150)
VLDL: 12 mg/dL (ref 0–40)

## 2014-05-13 LAB — VITAMIN D 25 HYDROXY (VIT D DEFICIENCY, FRACTURES): Vit D, 25-Hydroxy: 51 ng/mL (ref 30–89)

## 2014-05-13 LAB — TSH: TSH: 0.938 u[IU]/mL (ref 0.350–4.500)

## 2014-05-13 LAB — MAGNESIUM: Magnesium: 1.8 mg/dL (ref 1.5–2.5)

## 2014-05-13 LAB — INSULIN, FASTING: Insulin fasting, serum: 5.7 u[IU]/mL (ref 2.0–19.6)

## 2014-06-10 ENCOUNTER — Other Ambulatory Visit: Payer: Self-pay | Admitting: Internal Medicine

## 2014-06-10 DIAGNOSIS — F988 Other specified behavioral and emotional disorders with onset usually occurring in childhood and adolescence: Secondary | ICD-10-CM

## 2014-06-11 ENCOUNTER — Other Ambulatory Visit: Payer: Self-pay | Admitting: *Deleted

## 2014-06-11 DIAGNOSIS — F988 Other specified behavioral and emotional disorders with onset usually occurring in childhood and adolescence: Secondary | ICD-10-CM

## 2014-06-11 MED ORDER — AMPHETAMINE-DEXTROAMPHETAMINE 20 MG PO TABS: ORAL_TABLET | ORAL | Status: AC

## 2014-06-17 ENCOUNTER — Encounter: Payer: Self-pay | Admitting: Internal Medicine

## 2014-07-12 ENCOUNTER — Other Ambulatory Visit: Payer: Self-pay | Admitting: Internal Medicine

## 2014-07-12 MED ORDER — AMPHETAMINE-DEXTROAMPHETAMINE 20 MG PO TABS: ORAL_TABLET | ORAL | Status: DC

## 2014-08-02 ENCOUNTER — Other Ambulatory Visit: Payer: Self-pay | Admitting: Physician Assistant

## 2014-08-02 ENCOUNTER — Other Ambulatory Visit: Payer: Self-pay | Admitting: Internal Medicine

## 2014-08-02 MED ORDER — AMPHETAMINE-DEXTROAMPHETAMINE 20 MG PO TABS: ORAL_TABLET | ORAL | Status: DC

## 2014-08-16 ENCOUNTER — Other Ambulatory Visit: Payer: Self-pay | Admitting: Internal Medicine

## 2014-08-16 DIAGNOSIS — F988 Other specified behavioral and emotional disorders with onset usually occurring in childhood and adolescence: Secondary | ICD-10-CM

## 2014-08-16 MED ORDER — AMPHETAMINE-DEXTROAMPHETAMINE 20 MG PO TABS: ORAL_TABLET | ORAL | Status: DC

## 2014-08-27 ENCOUNTER — Encounter: Payer: Self-pay | Admitting: Physician Assistant

## 2014-08-27 ENCOUNTER — Ambulatory Visit (INDEPENDENT_AMBULATORY_CARE_PROVIDER_SITE_OTHER): Payer: BLUE CROSS/BLUE SHIELD | Admitting: Physician Assistant

## 2014-08-27 VITALS — BP 138/90 | HR 72 | Temp 98.7°F | Resp 16 | Ht 76.0 in | Wt 261.0 lb

## 2014-08-27 DIAGNOSIS — R7303 Prediabetes: Secondary | ICD-10-CM

## 2014-08-27 DIAGNOSIS — E782 Mixed hyperlipidemia: Secondary | ICD-10-CM

## 2014-08-27 DIAGNOSIS — Z79899 Other long term (current) drug therapy: Secondary | ICD-10-CM

## 2014-08-27 DIAGNOSIS — F909 Attention-deficit hyperactivity disorder, unspecified type: Secondary | ICD-10-CM

## 2014-08-27 DIAGNOSIS — F988 Other specified behavioral and emotional disorders with onset usually occurring in childhood and adolescence: Secondary | ICD-10-CM

## 2014-08-27 DIAGNOSIS — E559 Vitamin D deficiency, unspecified: Secondary | ICD-10-CM

## 2014-08-27 DIAGNOSIS — I10 Essential (primary) hypertension: Secondary | ICD-10-CM

## 2014-08-27 DIAGNOSIS — E069 Thyroiditis, unspecified: Secondary | ICD-10-CM

## 2014-08-27 DIAGNOSIS — F419 Anxiety disorder, unspecified: Secondary | ICD-10-CM

## 2014-08-27 DIAGNOSIS — R7309 Other abnormal glucose: Secondary | ICD-10-CM

## 2014-08-27 DIAGNOSIS — E291 Testicular hypofunction: Secondary | ICD-10-CM

## 2014-08-27 MED ORDER — BUPROPION HCL ER (XL) 150 MG PO TB24: 150.0000 mg | ORAL_TABLET | ORAL | Status: DC

## 2014-08-27 MED ORDER — AMPHETAMINE-DEXTROAMPHETAMINE 20 MG PO TABS: ORAL_TABLET | ORAL | Status: DC

## 2014-08-27 NOTE — Patient Instructions (Addendum)
Hospice counseling care- call if you would like someone to talk to Address: [address], [place], [place] [address]  Phone: [phone]

## 2014-08-27 NOTE — Progress Notes (Signed)
Assessment and Plan: Depression/axiety- denies SI/HI- will try wellbutrin ADD- will try wellbutrin and will give Adderall 20mg  #90 but will make last longer- will discuss with Dr. Oneta RackMckeown at CPE if should go up to max dose of adderall of 60mg  /day at that time. Patient is larger/taller so may tolerate it/need higher dose.  The patient was counseled on the addictive nature of the medication and was encouraged to take drug holidays when not needed.   Will get labs in 1 month at CPE with Dr. Oneta RackMckeown  HPI 38 y.o.male presents for follow up for ADD medication and for grieving. He states that he has been trying to pick up more shifts at work and states that the adderall was startings to not be as effective. He feels it was wearing off sooner so he started to take 1.5 mg or 30mg  BID which helped however he was running out. Also his wife, April, died of complications with diabetes in Dec and he states he has been struggling with this. He is eating, sleeping okay, He has his family support, not seeing a counselors, does not want meds.   Past Medical History  Diagnosis Date  . ADD (attention deficit disorder)   . Hypertension   . Hypogonadism male   . Vitamin D deficiency   . Anxiety   . Thyroiditis      No Known Allergies    Current Outpatient Prescriptions on File Prior to Visit  Medication Sig Dispense Refill  . amphetamine-dextroamphetamine (ADDERALL) 20 MG tablet Take 1/2 to 1 tablet 1 or 2 x day ONLY if needed for ADD 30 tablet 0   No current facility-administered medications on file prior to visit.    ROS: all negative except above.   Physical Exam: Filed Weights   08/27/14 1045  Weight: 261 lb (118.389 kg)   BP 138/90 mmHg  Pulse 72  Temp(Src) 98.7 F (37.1 C)  Resp 16  Ht 6\' 4"  (1.93 m)  Wt 261 lb (118.389 kg)  BMI 31.78 kg/m2 General Appearance: Well nourished, in no apparent distress. Eyes: PERRLA, EOMs, conjunctiva no swelling or erythema Sinuses: No Frontal/maxillary  tenderness ENT/Mouth: Ext aud canals clear, TMs without erythema, bulging. No erythema, swelling, or exudate on post pharynx.  Tonsils not swollen or erythematous. Hearing normal.  Neck: Supple, thyroid normal.  Respiratory: Respiratory effort normal, BS equal bilaterally without rales, rhonchi, wheezing or stridor.  Cardio: RRR with no MRGs. Brisk peripheral pulses without edema.  Abdomen: Soft, + BS.  Non tender, no guarding, rebound, hernias, masses. Lymphatics: Non tender without lymphadenopathy.  Musculoskeletal: Full ROM, 5/5 strength, normal gait.  Skin: Warm, dry without rashes, lesions, ecchymosis.  Neuro: Cranial nerves intact. Normal muscle tone, no cerebellar symptoms. Sensation intact.  Psych: Awake and oriented X 3, normal affect, Insight and Judgment appropriate.     Quentin Mullingollier, Roslyn Else, PA-C 11:16 AM [facility]

## 2014-09-21 ENCOUNTER — Encounter: Payer: Self-pay | Admitting: Internal Medicine

## 2014-09-23 ENCOUNTER — Encounter: Payer: Self-pay | Admitting: Internal Medicine

## 2014-09-23 ENCOUNTER — Ambulatory Visit (INDEPENDENT_AMBULATORY_CARE_PROVIDER_SITE_OTHER): Payer: BLUE CROSS/BLUE SHIELD | Admitting: Internal Medicine

## 2014-09-23 VITALS — BP 142/80 | HR 64 | Temp 99.7°F | Resp 16 | Ht 77.0 in | Wt 260.0 lb

## 2014-09-23 DIAGNOSIS — Z23 Encounter for immunization: Secondary | ICD-10-CM

## 2014-09-23 DIAGNOSIS — R5383 Other fatigue: Secondary | ICD-10-CM

## 2014-09-23 DIAGNOSIS — Z111 Encounter for screening for respiratory tuberculosis: Secondary | ICD-10-CM

## 2014-09-23 DIAGNOSIS — E782 Mixed hyperlipidemia: Secondary | ICD-10-CM

## 2014-09-23 DIAGNOSIS — R7303 Prediabetes: Secondary | ICD-10-CM

## 2014-09-23 DIAGNOSIS — E291 Testicular hypofunction: Secondary | ICD-10-CM

## 2014-09-23 DIAGNOSIS — E559 Vitamin D deficiency, unspecified: Secondary | ICD-10-CM

## 2014-09-23 DIAGNOSIS — F988 Other specified behavioral and emotional disorders with onset usually occurring in childhood and adolescence: Secondary | ICD-10-CM

## 2014-09-23 DIAGNOSIS — R945 Abnormal results of liver function studies: Secondary | ICD-10-CM

## 2014-09-23 DIAGNOSIS — R7989 Other specified abnormal findings of blood chemistry: Secondary | ICD-10-CM

## 2014-09-23 DIAGNOSIS — Z79899 Other long term (current) drug therapy: Secondary | ICD-10-CM

## 2014-09-23 DIAGNOSIS — I10 Essential (primary) hypertension: Secondary | ICD-10-CM

## 2014-09-23 DIAGNOSIS — Z113 Encounter for screening for infections with a predominantly sexual mode of transmission: Secondary | ICD-10-CM

## 2014-09-23 DIAGNOSIS — F909 Attention-deficit hyperactivity disorder, unspecified type: Secondary | ICD-10-CM

## 2014-09-23 DIAGNOSIS — Z125 Encounter for screening for malignant neoplasm of prostate: Secondary | ICD-10-CM

## 2014-09-23 DIAGNOSIS — Z1212 Encounter for screening for malignant neoplasm of rectum: Secondary | ICD-10-CM

## 2014-09-23 DIAGNOSIS — F419 Anxiety disorder, unspecified: Secondary | ICD-10-CM

## 2014-09-23 DIAGNOSIS — R7309 Other abnormal glucose: Secondary | ICD-10-CM

## 2014-09-23 MED ORDER — BUPROPION HCL ER (XL) 300 MG PO TB24: 300.0000 mg | ORAL_TABLET | ORAL | Status: DC

## 2014-09-23 MED ORDER — AMPHETAMINE-DEXTROAMPHETAMINE 20 MG PO TABS: ORAL_TABLET | ORAL | Status: DC

## 2014-09-23 NOTE — Patient Instructions (Signed)

## 2014-09-23 NOTE — Progress Notes (Signed)
Patient ID: Cameron Donovan, male   DOB: [birth year], 39 y.o.   MRN: [id number] Annual Comprehensive Examination  This very nice 39 y.o. recently widowed WM presents for complete physical.  Patient has been followed for  Labile HTN, Prediabetes, Hyperlipidemia, ADD and Vitamin D Deficiency.   HTN predates since 2007 and has been monitored expectantly. Patient's BP has been controlled at home.Today's BP is borderline elevated at 142/80. Patient denies any cardiac symptoms as chest pain, palpitations, shortness of breath, dizziness or ankle swelling.   Patient's hyperlipidemia is not controlled with diet.  Last lipids were Total  Chol 179; HDL  57;  Elevated LDL 110*; Trig 62 on 05/12/2014.   Patient has prediabetes since jJune 2012 with an elevated A1c of 5.7%  and patient denies reactive hypoglycemic symptoms, visual blurring, diabetic polys or paresthesias. Last A1c was  5.3% on   05/12/2014.   Finally, patient has history of Vitamin D Deficiency of 39 in 2013  and last vitamin D was  51 on  05/12/2014.  Medication Sig  .  ADDERALL 20 MG tablet Take 1/2 to 1 tablet BID as needed for ADD  . buPROPion  XL 150 MG 24 hr tab Take 1 tablet (150 mg total) by mouth every morning.   No Known Allergies   Past Medical History  Diagnosis Date  . ADD (attention deficit disorder)   . Hypertension   . Hypogonadism male   . Vitamin D deficiency   . Anxiety   . Thyroiditis    Health Maintenance  Topic Date Due  . HIV Screening  03/17/1989  . TETANUS/TDAP  03/17/1993  . INFLUENZA VACCINE  02/27/2014   Immunization History  Administered Date(s) Administered  . PPD Test 09/23/2014  . Tdap 09/23/2014    History   Social History  . Marital Status: Married    Spouse Name: N/A  . Number of Children: N/A  . Years of Education: N/A   Occupational History  . Welder   Social History Main Topics  . Smoking status: Former Smoker    Types: Cigarettes    Start date: 06/03/2010  . Smokeless  tobacco: Current User    Types: Chew  . Alcohol Use: 3.6 oz/week    6 Cans of beer per week  . Drug Use: 1.00 per week    Special: Marijuana  . Sexual Activity:    Partners: Female     ROS Constitutional: Denies fever, chills, weight loss/gain, headaches, insomnia, fatigue, night sweats or change in appetite. Eyes: Denies redness, blurred vision, diplopia, discharge, itchy or watery eyes.  ENT: Denies discharge, congestion, post nasal drip, epistaxis, sore throat, earache, hearing loss, dental pain, Tinnitus, Vertigo, Sinus pain or snoring.  Cardio: Denies chest pain, palpitations, irregular heartbeat, syncope, dyspnea, diaphoresis, orthopnea, PND, claudication or edema Respiratory: denies cough, dyspnea, DOE, pleurisy, hoarseness, laryngitis or wheezing.  Gastrointestinal: Denies dysphagia, heartburn, reflux, water brash, pain, cramps, nausea, vomiting, bloating, diarrhea, constipation, hematemesis, melena, hematochezia, jaundice or hemorrhoids Genitourinary: Denies dysuria, frequency, urgency, nocturia, hesitancy, discharge, hematuria or flank pain Musculoskeletal: Denies arthralgia, myalgia, stiffness, Jt. Swelling, pain, limp or strain/sprain. Denies Falls. Skin: Denies puritis, rash, hives, warts, acne, eczema or change in skin lesion Neuro: No weakness, tremor, incoordination, spasms, paresthesia or pain Psychiatric: Denies confusion, memory loss or sensory loss. Denies Depression. Endocrine: Denies change in weight, skin, hair change, nocturia, and paresthesia, diabetic polys, visual blurring or hyper / hypo glycemic episodes.  Heme/Lymph: No excessive bleeding, bruising or enlarged lymph nodes.  Physical  Exam  BP 142/80 mmHg  Pulse 64  Temp(Src) 99.7 F (37.6 C)  Resp 16  Ht  (1.956 m)  Wt 260 lb (117.935 kg)  BMI 30.83 kg/m2  General Appearance: Well nourished, in no apparent distress. Eyes: PERRLA, EOMs, conjunctiva no swelling or erythema, normal fundi and  vessels. Sinuses: No frontal/maxillary tenderness ENT/Mouth: EACs patent / TMs  nl. Nares clear without erythema, swelling, mucoid exudates. Oral hygiene is good. No erythema, swelling, or exudate. Tongue normal, non-obstructing. Tonsils not swollen or erythematous. Hearing normal.  Neck: Supple, thyroid normal. No bruits, nodes or JVD. Respiratory: Respiratory effort normal.  BS equal and clear bilateral without rales, rhonci, wheezing or stridor. Cardio: Heart sounds are normal with regular rate and rhythm and no murmurs, rubs or gallops. Peripheral pulses are normal and equal bilaterally without edema. No aortic or femoral bruits. Chest: symmetric with normal excursions and percussion.  Abdomen: Flat, soft, with bowl sounds. Nontender, no guarding, rebound, hernias, masses, or organomegaly.  Lymphatics: Non tender without lymphadenopathy.  Genitourinary: No hernias.Testes nl. DRE - prostate nl for age - smooth & firm w/o nodules. Musculoskeletal: Full ROM all peripheral extremities, joint stability, 5/5 strength, and normal gait. Skin: Warm and dry without rashes, lesions, cyanosis, clubbing or  ecchymosis.  Neuro: Cranial nerves intact, reflexes equal bilaterally. Normal muscle tone, no cerebellar symptoms. Sensation intact.  Pysch: Awake and oriented X 3 with normal affect, insight and judgment appropriate.   Assessment and Plan  1. Essential hypertension  - Microalbumin / creatinine urine ratio - EKG 12-Lead  2. Hyperlipidemia  - Lipid panel  3. Prediabetes  - Hemoglobin A1c - Insulin, fasting  4. Vitamin D deficiency  - Vit D  25 hydroxy   5. Testosterone Deficiency   6. ADD (attention deficit disorder)  - Rx Wellbutrin 300 mg XL for reactive depression from recent loss of wife and also for his ADD.  - amphetamine-dextroamphetamine (ADDERALL) 20 MG tablet; Take 1/2 to 1 tablet BID as needed for ADD  Dispense: 60 tablet; Refill: 0  7. Anxiety   8. Screening for  rectal cancer  - POC Hemoccult Bld/Stl   9. Prostate cancer screening  - PSA - Testosterone  10. VD (venereal disease) screening   11. Abnormal LFTs  - Hepatitis A antibody, total - Hepatitis B core antibody, total - Hepatitis B e antibody - Hepatitis B surface antibody - Hepatitis C antibody  12. Medication management  - Urine Microscopic - CBC with Differential/Platelet - BASIC METABOLIC PANEL WITH GFR - Hepatic function panel - Magnesium  13. Other fatigue  - Vitamin B12 - Iron and TIBC - TSH  14. Screening examination for pulmonary tuberculosis  - PPD  15. Need for prophylactic vaccination with tetanus-diphtheria (TD)  - Tdap vaccine greater than or equal to 7yo IM   Continue prudent diet as discussed, weight control, BP monitoring, regular exercise, and medications as discussed.  Discussed med effects and SE's. Routine screening labs and tests as requested with regular follow-up as recommended.

## 2014-09-24 LAB — URINALYSIS, MICROSCOPIC ONLY
BACTERIA UA: NONE SEEN
Casts: NONE SEEN
Crystals: NONE SEEN
Squamous Epithelial / LPF: NONE SEEN

## 2014-09-24 LAB — CBC WITH DIFFERENTIAL/PLATELET
BASOS ABS: 0.1 10*3/uL (ref 0.0–0.1)
Basophils Relative: 1 % (ref 0–1)
EOS ABS: 0.3 10*3/uL (ref 0.0–0.7)
EOS PCT: 3 % (ref 0–5)
HCT: 40.2 % (ref 39.0–52.0)
Hemoglobin: 13.3 g/dL (ref 13.0–17.0)
Lymphocytes Relative: 20 % (ref 12–46)
Lymphs Abs: 1.8 10*3/uL (ref 0.7–4.0)
MCH: 31.2 pg (ref 26.0–34.0)
MCHC: 33.1 g/dL (ref 30.0–36.0)
MCV: 94.4 fL (ref 78.0–100.0)
MONO ABS: 0.8 10*3/uL (ref 0.1–1.0)
MPV: 9.7 fL (ref 8.6–12.4)
Monocytes Relative: 9 % (ref 3–12)
Neutro Abs: 6 10*3/uL (ref 1.7–7.7)
Neutrophils Relative %: 67 % (ref 43–77)
PLATELETS: 330 10*3/uL (ref 150–400)
RBC: 4.26 MIL/uL (ref 4.22–5.81)
RDW: 13.4 % (ref 11.5–15.5)
WBC: 8.9 10*3/uL (ref 4.0–10.5)

## 2014-09-24 LAB — BASIC METABOLIC PANEL WITH GFR
BUN: 13 mg/dL (ref 6–23)
CHLORIDE: 100 meq/L (ref 96–112)
CO2: 27 mEq/L (ref 19–32)
CREATININE: 1.07 mg/dL (ref 0.50–1.35)
Calcium: 9.4 mg/dL (ref 8.4–10.5)
GFR, EST NON AFRICAN AMERICAN: 86 mL/min
GFR, Est African American: >89 mL/min
Glucose, Bld: 97 mg/dL (ref 70–99)
POTASSIUM: 4.2 meq/L (ref 3.5–5.3)
Sodium: 136 mEq/L (ref 135–145)

## 2014-09-24 LAB — LIPID PANEL
CHOL/HDL RATIO: 2.8 ratio
Cholesterol: 160 mg/dL (ref 0–200)
HDL: 58 mg/dL (ref >=40)
LDL CALC: 86 mg/dL (ref 0–99)
Triglycerides: 81 mg/dL (ref <150)
VLDL: 16 mg/dL (ref 0–40)

## 2014-09-24 LAB — HEPATIC FUNCTION PANEL
ALBUMIN: 4.6 g/dL (ref 3.5–5.2)
ALT: 22 U/L (ref 0–53)
AST: 19 U/L (ref 0–37)
Alkaline Phosphatase: 60 U/L (ref 39–117)
BILIRUBIN INDIRECT: 0.5 mg/dL (ref 0.2–1.2)
Bilirubin, Direct: 0.1 mg/dL (ref 0.0–0.3)
TOTAL PROTEIN: 7.4 g/dL (ref 6.0–8.3)
Total Bilirubin: 0.6 mg/dL (ref 0.2–1.2)

## 2014-09-24 LAB — INSULIN, FASTING: Insulin fasting, serum: 15.3 u[IU]/mL (ref 2.0–19.6)

## 2014-09-24 LAB — HEMOGLOBIN A1C
Hgb A1c MFr Bld: 5.4 % (ref <5.7)
MEAN PLASMA GLUCOSE: 108 mg/dL (ref <117)

## 2014-09-24 LAB — IRON AND TIBC
%SAT: 21 % (ref 20–55)
Iron: 66 ug/dL (ref 42–165)
TIBC: 312 ug/dL (ref 215–435)
UIBC: 246 ug/dL (ref 125–400)

## 2014-09-24 LAB — MICROALBUMIN / CREATININE URINE RATIO
Creatinine, Urine: 70.2 mg/dL
MICROALB UR: 0.4 mg/dL (ref <2.0)
Microalb Creat Ratio: 5.7 mg/g (ref 0.0–30.0)

## 2014-09-24 LAB — HEPATITIS B CORE ANTIBODY, TOTAL: Hep B Core Total Ab: NONREACTIVE

## 2014-09-24 LAB — PSA: PSA: 0.52 ng/mL (ref <=4.00)

## 2014-09-24 LAB — VITAMIN D 25 HYDROXY (VIT D DEFICIENCY, FRACTURES): VIT D 25 HYDROXY: 23 ng/mL — AB (ref 30–100)

## 2014-09-24 LAB — TESTOSTERONE: Testosterone: 428 ng/dL (ref 300–890)

## 2014-09-24 LAB — HEPATITIS C ANTIBODY: HCV Ab: NEGATIVE

## 2014-09-24 LAB — VITAMIN B12: VITAMIN B 12: 322 pg/mL (ref 211–911)

## 2014-09-24 LAB — HEPATITIS B SURFACE ANTIBODY,QUALITATIVE: Hep B S Ab: NEGATIVE

## 2014-09-24 LAB — TSH: TSH: 2.045 u[IU]/mL (ref 0.350–4.500)

## 2014-09-24 LAB — HEPATITIS A ANTIBODY, TOTAL: HEP A TOTAL AB: NONREACTIVE

## 2014-09-24 LAB — MAGNESIUM: Magnesium: 1.8 mg/dL (ref 1.5–2.5)

## 2014-09-27 LAB — HEPATITIS B E ANTIBODY: HEPATITIS BE ANTIBODY: NONREACTIVE

## 2014-10-21 ENCOUNTER — Other Ambulatory Visit: Payer: Self-pay | Admitting: Internal Medicine

## 2014-10-21 DIAGNOSIS — F988 Other specified behavioral and emotional disorders with onset usually occurring in childhood and adolescence: Secondary | ICD-10-CM

## 2014-10-21 MED ORDER — AMPHETAMINE-DEXTROAMPHETAMINE 20 MG PO TABS: ORAL_TABLET | ORAL | Status: DC

## 2014-11-19 ENCOUNTER — Other Ambulatory Visit: Payer: Self-pay | Admitting: Internal Medicine

## 2014-11-19 DIAGNOSIS — F988 Other specified behavioral and emotional disorders with onset usually occurring in childhood and adolescence: Secondary | ICD-10-CM

## 2014-11-19 MED ORDER — AMPHETAMINE-DEXTROAMPHETAMINE 20 MG PO TABS: ORAL_TABLET | ORAL | Status: AC

## 2014-12-22 ENCOUNTER — Other Ambulatory Visit: Payer: Self-pay | Admitting: *Deleted

## 2014-12-22 MED ORDER — AMPHETAMINE-DEXTROAMPHETAMINE 20 MG PO TABS: ORAL_TABLET | ORAL | Status: DC

## 2015-01-21 ENCOUNTER — Other Ambulatory Visit: Payer: Self-pay | Admitting: Internal Medicine

## 2015-01-21 DIAGNOSIS — F988 Other specified behavioral and emotional disorders with onset usually occurring in childhood and adolescence: Secondary | ICD-10-CM

## 2015-01-21 MED ORDER — AMPHETAMINE-DEXTROAMPHETAMINE 20 MG PO TABS: ORAL_TABLET | ORAL | Status: DC

## 2015-02-22 ENCOUNTER — Other Ambulatory Visit: Payer: Self-pay | Admitting: *Deleted

## 2015-02-22 DIAGNOSIS — F988 Other specified behavioral and emotional disorders with onset usually occurring in childhood and adolescence: Secondary | ICD-10-CM

## 2015-02-22 MED ORDER — AMPHETAMINE-DEXTROAMPHETAMINE 20 MG PO TABS: ORAL_TABLET | ORAL | Status: DC

## 2015-03-28 ENCOUNTER — Ambulatory Visit (INDEPENDENT_AMBULATORY_CARE_PROVIDER_SITE_OTHER): Payer: BLUE CROSS/BLUE SHIELD | Admitting: Physician Assistant

## 2015-03-28 ENCOUNTER — Encounter: Payer: Self-pay | Admitting: Physician Assistant

## 2015-03-28 VITALS — BP 180/108 | HR 68 | Temp 97.7°F | Resp 16 | Ht 77.0 in | Wt 252.6 lb

## 2015-03-28 DIAGNOSIS — R7309 Other abnormal glucose: Secondary | ICD-10-CM

## 2015-03-28 DIAGNOSIS — I10 Essential (primary) hypertension: Secondary | ICD-10-CM

## 2015-03-28 DIAGNOSIS — E559 Vitamin D deficiency, unspecified: Secondary | ICD-10-CM | POA: Diagnosis not present

## 2015-03-28 DIAGNOSIS — E069 Thyroiditis, unspecified: Secondary | ICD-10-CM

## 2015-03-28 DIAGNOSIS — Z79899 Other long term (current) drug therapy: Secondary | ICD-10-CM | POA: Diagnosis not present

## 2015-03-28 DIAGNOSIS — F909 Attention-deficit hyperactivity disorder, unspecified type: Secondary | ICD-10-CM | POA: Diagnosis not present

## 2015-03-28 DIAGNOSIS — R899 Unspecified abnormal finding in specimens from other organs, systems and tissues: Secondary | ICD-10-CM

## 2015-03-28 DIAGNOSIS — F988 Other specified behavioral and emotional disorders with onset usually occurring in childhood and adolescence: Secondary | ICD-10-CM

## 2015-03-28 DIAGNOSIS — E782 Mixed hyperlipidemia: Secondary | ICD-10-CM

## 2015-03-28 DIAGNOSIS — R7303 Prediabetes: Secondary | ICD-10-CM

## 2015-03-28 MED ORDER — AMPHETAMINE-DEXTROAMPHETAMINE 20 MG PO TABS: ORAL_TABLET | ORAL | Status: DC

## 2015-03-28 MED ORDER — ENALAPRIL MALEATE 10 MG PO TABS: 10.0000 mg | ORAL_TABLET | Freq: Every day | ORAL | Status: DC

## 2015-03-28 MED ORDER — BUPROPION HCL ER (XL) 300 MG PO TB24: 300.0000 mg | ORAL_TABLET | ORAL | Status: DC

## 2015-03-28 MED ORDER — DIAZEPAM 2 MG PO TABS: ORAL_TABLET | ORAL | Status: DC

## 2015-03-28 NOTE — Patient Instructions (Addendum)
.You can take tylenol ( ) or tylenol arthritis ( ) with the meloxicam/antiinflammatories. The max you can take of tylenol a day is  daily, this is a max of 6 pills a day of the regular tyelnol ( ) or a max of 4 a day of the tylenol arthritis ( ) as long as no other medications you are taking contain tylenol.   ACE inhibitors are blood pressure medications that protect your heart and kidneys. It can cause two symptoms: The most common symptom is a dry cough/tickle in your throat that can happen the first day you take it or 5 years after you have been taking it. Please call us if you have this and we can switch it to a different medications. The least common side effect is called angioedema which is swelling of your lips and tongue and can cause problems with your breathing. This is a very very rare side effect but very serious. If this happens please stop the medication and go to the ER.    Monitor your blood pressure at home, if it is above 140/90 consistently call the office so we can adjust your medications. Go to the ER if any CP, SOB, nausea, dizziness, severe HA, changes vision/speech  DASH Eating Plan DASH stands for "Dietary Approaches to Stop Hypertension." The DASH eating plan is a healthy eating plan that has been shown to reduce high blood pressure (hypertension). Additional health benefits may include reducing the risk of type 2 diabetes mellitus, heart disease, and stroke. The DASH eating plan may also help with weight loss. WHAT DO I NEED TO KNOW ABOUT THE DASH EATING PLAN? For the DASH eating plan, you will follow these general guidelines:  Choose foods with a percent daily value for sodium of less than 5% (as listed on the food label).  Use salt-free seasonings or herbs instead of table salt or sea salt.  Check with your health care provider or pharmacist before using salt substitutes.  Eat lower-sodium products, often labeled as "lower sodium" or "no salt  added."  Eat fresh foods.  Eat more vegetables, fruits, and low-fat dairy products.  Choose whole grains. Look for the word "whole" as the first word in the ingredient list.  Choose fish and skinless chicken or [place] more often than red meat. Limit fish, poultry, and meat to 6 oz (170 g) each day.  Limit sweets, desserts, sugars, and sugary drinks.  Choose heart-healthy fats.  Limit cheese to 1 oz (28 g) per day.  Eat more home-cooked food and less restaurant, buffet, and fast food.  Limit fried foods.  Cook foods using methods other than frying.  Limit canned vegetables. If you do use them, rinse them well to decrease the sodium.  When eating at a restaurant, ask that your food be prepared with less salt, or no salt if possible. WHAT FOODS CAN I EAT? Seek help from a dietitian for individual calorie needs. Grains Whole grain or whole wheat bread. Brown rice. Whole grain or whole wheat pasta. Quinoa, bulgur, and whole grain cereals. Low-sodium cereals. Corn or whole wheat flour tortillas. Whole grain cornbread. Whole grain crackers. Low-sodium crackers. Vegetables Fresh or frozen vegetables (raw, steamed, roasted, or grilled). Low-sodium or reduced-sodium tomato and vegetable juices. Low-sodium or reduced-sodium tomato sauce and paste. Low-sodium or reduced-sodium canned vegetables.  Fruits All fresh, canned (in natural juice), or frozen fruits. Meat and Other Protein Products Ground beef (85% or leaner), grass-fed beef, or beef trimmed of fat. Skinless chicken or [place]. Ground chicken  or [place]. Pork trimmed of fat. All fish and seafood. Eggs. Dried beans, peas, or lentils. Unsalted nuts and seeds. Unsalted canned beans. Dairy Low-fat dairy products, such as skim or 1% milk, 2% or reduced-fat cheeses, low-fat ricotta or cottage cheese, or plain low-fat yogurt. Low-sodium or reduced-sodium cheeses. Fats and Oils Tub margarines without trans fats. Light or reduced-fat  mayonnaise and salad dressings (reduced sodium). Avocado. Safflower, olive, or canola oils. Natural peanut or almond butter. Other Unsalted popcorn and pretzels. The items listed above may not be a complete list of recommended foods or beverages. Contact your dietitian for more options. WHAT FOODS ARE NOT RECOMMENDED? Grains White bread. White pasta. White rice. Refined cornbread. Bagels and croissants. Crackers that contain trans fat. Vegetables Creamed or fried vegetables. Vegetables in a cheese sauce. Regular canned vegetables. Regular canned tomato sauce and paste. Regular tomato and vegetable juices. Fruits Dried fruits. Canned fruit in light or heavy syrup. Fruit juice. Meat and Other Protein Products Fatty cuts of meat. Ribs, chicken wings, bacon, sausage, bologna, salami, chitterlings, fatback, hot dogs, bratwurst, and packaged luncheon meats. Salted nuts and seeds. Canned beans with salt. Dairy Whole or 2% milk, cream, half-and-half, and cream cheese. Whole-fat or sweetened yogurt. Full-fat cheeses or blue cheese. Nondairy creamers and whipped toppings. Processed cheese, cheese spreads, or cheese curds. Condiments Onion and garlic salt, seasoned salt, table salt, and sea salt. Canned and packaged gravies. Worcestershire sauce. Tartar sauce. Barbecue sauce. Teriyaki sauce. Soy sauce, including reduced sodium. Steak sauce. Fish sauce. Oyster sauce. Cocktail sauce. Horseradish. Ketchup and mustard. Meat flavorings and tenderizers. Bouillon cubes. Hot sauce. Tabasco sauce. Marinades. Taco seasonings. Relishes. Fats and Oils Butter, stick margarine, lard, shortening, ghee, and bacon fat. Coconut, palm kernel, or palm oils. Regular salad dressings. Other Pickles and olives. Salted popcorn and pretzels. The items listed above may not be a complete list of foods and beverages to avoid. Contact your dietitian for more information. WHERE CAN I FIND MORE INFORMATION? National Heart, Lung, and  Blood Institute: [employer] Document Released: 07/05/2011 Document Revised: 11/30/2013 Document Reviewed: 05/20/2013 [facility] Patient Information 2015 [place], [place]. This information is not intended to replace advice given to you by your health care provider. Make sure you discuss any questions you have with your health care provider.

## 2015-03-28 NOTE — Progress Notes (Signed)
Assessment and Plan:  1. Hypertension -start on enalapril , monitor blood pressure at home. Continue DASH diet.  Reminder to go to the ER if any CP, SOB, nausea, dizziness, severe HA, changes vision/speech, left arm numbness and tingling and jaw pain.  2. Cholesterol -Continue diet and exercise. Check cholesterol.   3. Prediabetes  -Continue diet and exercise. Check A1C  4. Vitamin D Def - check level and continue medications.   5. Depression/anxiety Valium 1 mg, 1/2 - 1 at night for sleep Continue wellbutrin Suggest counseling  Follow up 1 month to recheck BP and talk about depression/sleep  Continue diet and meds as discussed. Further disposition pending results of labs. Over 30 minutes of exam, counseling, chart review, and critical decision making was performed  HPI 39 y.o. recently widowed white male  presents for 3 month follow up on hypertension, cholesterol, prediabetes, and vitamin D deficiency.   His blood pressure has not been controlled at home, today their BP is BP: (!) 180/108 mmHg, rechecked 170/90.  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   He does workout. He denies chest pain, shortness of breath, dizziness.  He is not on cholesterol medication and denies myalgias. His cholesterol is at goal. The cholesterol last visit was:   Lab Results  Component Value Date   CHOL 160 09/23/2014   HDL 58 09/23/2014   LDLCALC 86 09/23/2014   TRIG 81 09/23/2014   CHOLHDL 2.8 09/23/2014    He has been working on diet and exercise for prediabetes, A1C was 5.7 in 2012, and denies paresthesia of the feet, polydipsia, polyuria and visual disturbances. Last A1C in the office was:  Lab Results  Component Value Date   HGBA1C 5.4 09/23/2014   Patient is on Vitamin D supplement.   Lab Results  Component Value Date   VD25OH 47* 09/23/2014     Patient is on an ADD medication, he states that the medication is helping and he denies any adverse reactions. He is on  1/2- 2 pills a day.  He is still in mourning from his wife April passing in Dec, and his father is not in good health. He admits to being depressed, has trouble sleeping, no SI/HI.   Current Medications:  Current Outpatient Prescriptions on File Prior to Visit  Medication Sig Dispense Refill  . amphetamine-dextroamphetamine (ADDERALL) 20 MG tablet Take 1/2 to 1 tablet 2 times daily as needed for ADD. Only take on work days & Rx will last 6 weeks 60 tablet 0  . buPROPion (WELLBUTRIN XL) 300 MG 24 hr tablet Take 1 tablet (300 mg total) by mouth every morning. 30 tablet 2   No current facility-administered medications on file prior to visit.   Medical History:  Past Medical History  Diagnosis Date  . ADD (attention deficit disorder)   . Hypertension   . Hypogonadism male   . Vitamin D deficiency   . Anxiety   . Thyroiditis    Allergies: No Known Allergies   Review of Systems:  Review of Systems  Constitutional: Negative.   HENT: Negative.   Eyes: Negative.   Respiratory: Negative.   Cardiovascular: Negative.   Gastrointestinal: Negative.   Genitourinary: Negative.   Musculoskeletal: Positive for neck pain. Negative for myalgias, back pain, joint pain and falls.  Skin: Negative.   Neurological: Negative.   Endo/Heme/Allergies: Negative.   Psychiatric/Behavioral: Positive for depression. Negative for suicidal ideas, hallucinations, memory loss and substance abuse. The patient has insomnia (  will wake up 1-2 x a night. ). The patient is not nervous/anxious.     Family history- Review and unchanged Social history- Review and unchanged Physical Exam: BP 180/108 mmHg  Pulse 68  Temp(Src) 97.7 F (36.5 C)  Resp 16  Ht 6\' 5"  (1.956 m)  Wt 252 lb 9.6 oz (114.579 kg)  BMI 29.95 kg/m2 Wt Readings from Last 3 Encounters:  03/28/15 252 lb 9.6 oz (114.579 kg)  09/23/14 260 lb (117.935 kg)  08/27/14 261 lb (118.389 kg)   General Appearance: Well nourished, in no apparent  distress. Eyes: PERRLA, EOMs, conjunctiva no swelling or erythema Sinuses: No Frontal/maxillary tenderness ENT/Mouth: Ext aud canals clear, TMs without erythema, bulging. No erythema, swelling, or exudate on post pharynx.  Tonsils not swollen or erythematous. Hearing normal.  Neck: Supple, thyroid normal.  Respiratory: Respiratory effort normal, BS equal bilaterally without rales, rhonchi, wheezing or stridor.  Cardio: RRR with no MRGs. Brisk peripheral pulses without edema.  Abdomen: Soft, + BS,  Non tender, no guarding, rebound, hernias, masses. Lymphatics: Non tender without lymphadenopathy.  Musculoskeletal: Full ROM, 5/5 strength, Normal gait Skin: Warm, dry without rashes, lesions, ecchymosis.  Neuro: Cranial nerves intact. Normal muscle tone, no cerebellar symptoms. Psych: Awake and oriented X 3, normal affect, Insight and Judgment appropriate.    Quentin Mulling, PA-C 3:51 PM [facility]

## 2015-03-29 LAB — CBC WITH DIFFERENTIAL/PLATELET
BASOS PCT: 1 % (ref 0–1)
Basophils Absolute: 0.1 10*3/uL (ref 0.0–0.1)
EOS ABS: 0.1 10*3/uL (ref 0.0–0.7)
EOS PCT: 1 % (ref 0–5)
HCT: 42.5 % (ref 39.0–52.0)
Hemoglobin: 14.2 g/dL (ref 13.0–17.0)
LYMPHS ABS: 1.7 10*3/uL (ref 0.7–4.0)
Lymphocytes Relative: 15 % (ref 12–46)
MCH: 32.1 pg (ref 26.0–34.0)
MCHC: 33.4 g/dL (ref 30.0–36.0)
MCV: 96.2 fL (ref 78.0–100.0)
MONO ABS: 0.9 10*3/uL (ref 0.1–1.0)
MONOS PCT: 8 % (ref 3–12)
MPV: 9.2 fL (ref 8.6–12.4)
Neutro Abs: 8.5 10*3/uL — ABNORMAL HIGH (ref 1.7–7.7)
Neutrophils Relative %: 75 % (ref 43–77)
PLATELETS: 413 10*3/uL — AB (ref 150–400)
RBC: 4.42 MIL/uL (ref 4.22–5.81)
RDW: 13.5 % (ref 11.5–15.5)
WBC: 11.3 10*3/uL — ABNORMAL HIGH (ref 4.0–10.5)

## 2015-03-29 LAB — VITAMIN D 25 HYDROXY (VIT D DEFICIENCY, FRACTURES): VIT D 25 HYDROXY: 30 ng/mL (ref 30–100)

## 2015-03-29 LAB — BASIC METABOLIC PANEL WITH GFR
BUN: 6 mg/dL — AB (ref 7–25)
CALCIUM: 9.3 mg/dL (ref 8.6–10.3)
CHLORIDE: 100 mmol/L (ref 98–110)
CO2: 25 mmol/L (ref 20–31)
CREATININE: 1.16 mg/dL (ref 0.60–1.35)
GFR, Est African American: >89 mL/min (ref >=60)
GFR, Est Non African American: 78 mL/min (ref >=60)
Glucose, Bld: 96 mg/dL (ref 65–99)
Potassium: 4.4 mmol/L (ref 3.5–5.3)
SODIUM: 137 mmol/L (ref 135–146)

## 2015-03-29 LAB — HEMOGLOBIN A1C
HEMOGLOBIN A1C: 5.3 % (ref <5.7)
MEAN PLASMA GLUCOSE: 105 mg/dL (ref <117)

## 2015-03-29 LAB — HEPATIC FUNCTION PANEL
ALBUMIN: 5 g/dL (ref 3.6–5.1)
ALT: 37 U/L (ref 9–46)
AST: 32 U/L (ref 10–40)
Alkaline Phosphatase: 61 U/L (ref 40–115)
BILIRUBIN TOTAL: 0.5 mg/dL (ref 0.2–1.2)
Bilirubin, Direct: 0.1 mg/dL (ref <=0.2)
Indirect Bilirubin: 0.4 mg/dL (ref 0.2–1.2)
Total Protein: 7.4 g/dL (ref 6.1–8.1)

## 2015-03-29 LAB — MAGNESIUM: Magnesium: 1.7 mg/dL (ref 1.5–2.5)

## 2015-03-29 LAB — LIPID PANEL
CHOLESTEROL: 178 mg/dL (ref 125–200)
HDL: 58 mg/dL (ref >=40)
LDL Cholesterol: 77 mg/dL (ref <130)
Total CHOL/HDL Ratio: 3.1 Ratio (ref <=5.0)
Triglycerides: 215 mg/dL — ABNORMAL HIGH (ref <150)
VLDL: 43 mg/dL — ABNORMAL HIGH (ref <30)

## 2015-03-29 LAB — TSH: TSH: 1.204 u[IU]/mL (ref 0.350–4.500)

## 2015-03-29 NOTE — Addendum Note (Signed)
Addended by: Quentin Mulling R on: 03/29/2015 07:20 AM   Modules accepted: Orders

## 2015-04-05 ENCOUNTER — Other Ambulatory Visit: Payer: Self-pay

## 2015-04-07 ENCOUNTER — Other Ambulatory Visit: Payer: Self-pay

## 2015-04-12 ENCOUNTER — Other Ambulatory Visit: Payer: Self-pay

## 2015-04-12 IMAGING — CR DG CHEST 2V
2 series · 2 of 2 positions shown · non-contrast
Comparison: 09/02/2013

CLINICAL DATA: Cough and chest pain

EXAM:
CHEST  2 VIEW

[w chest pa]
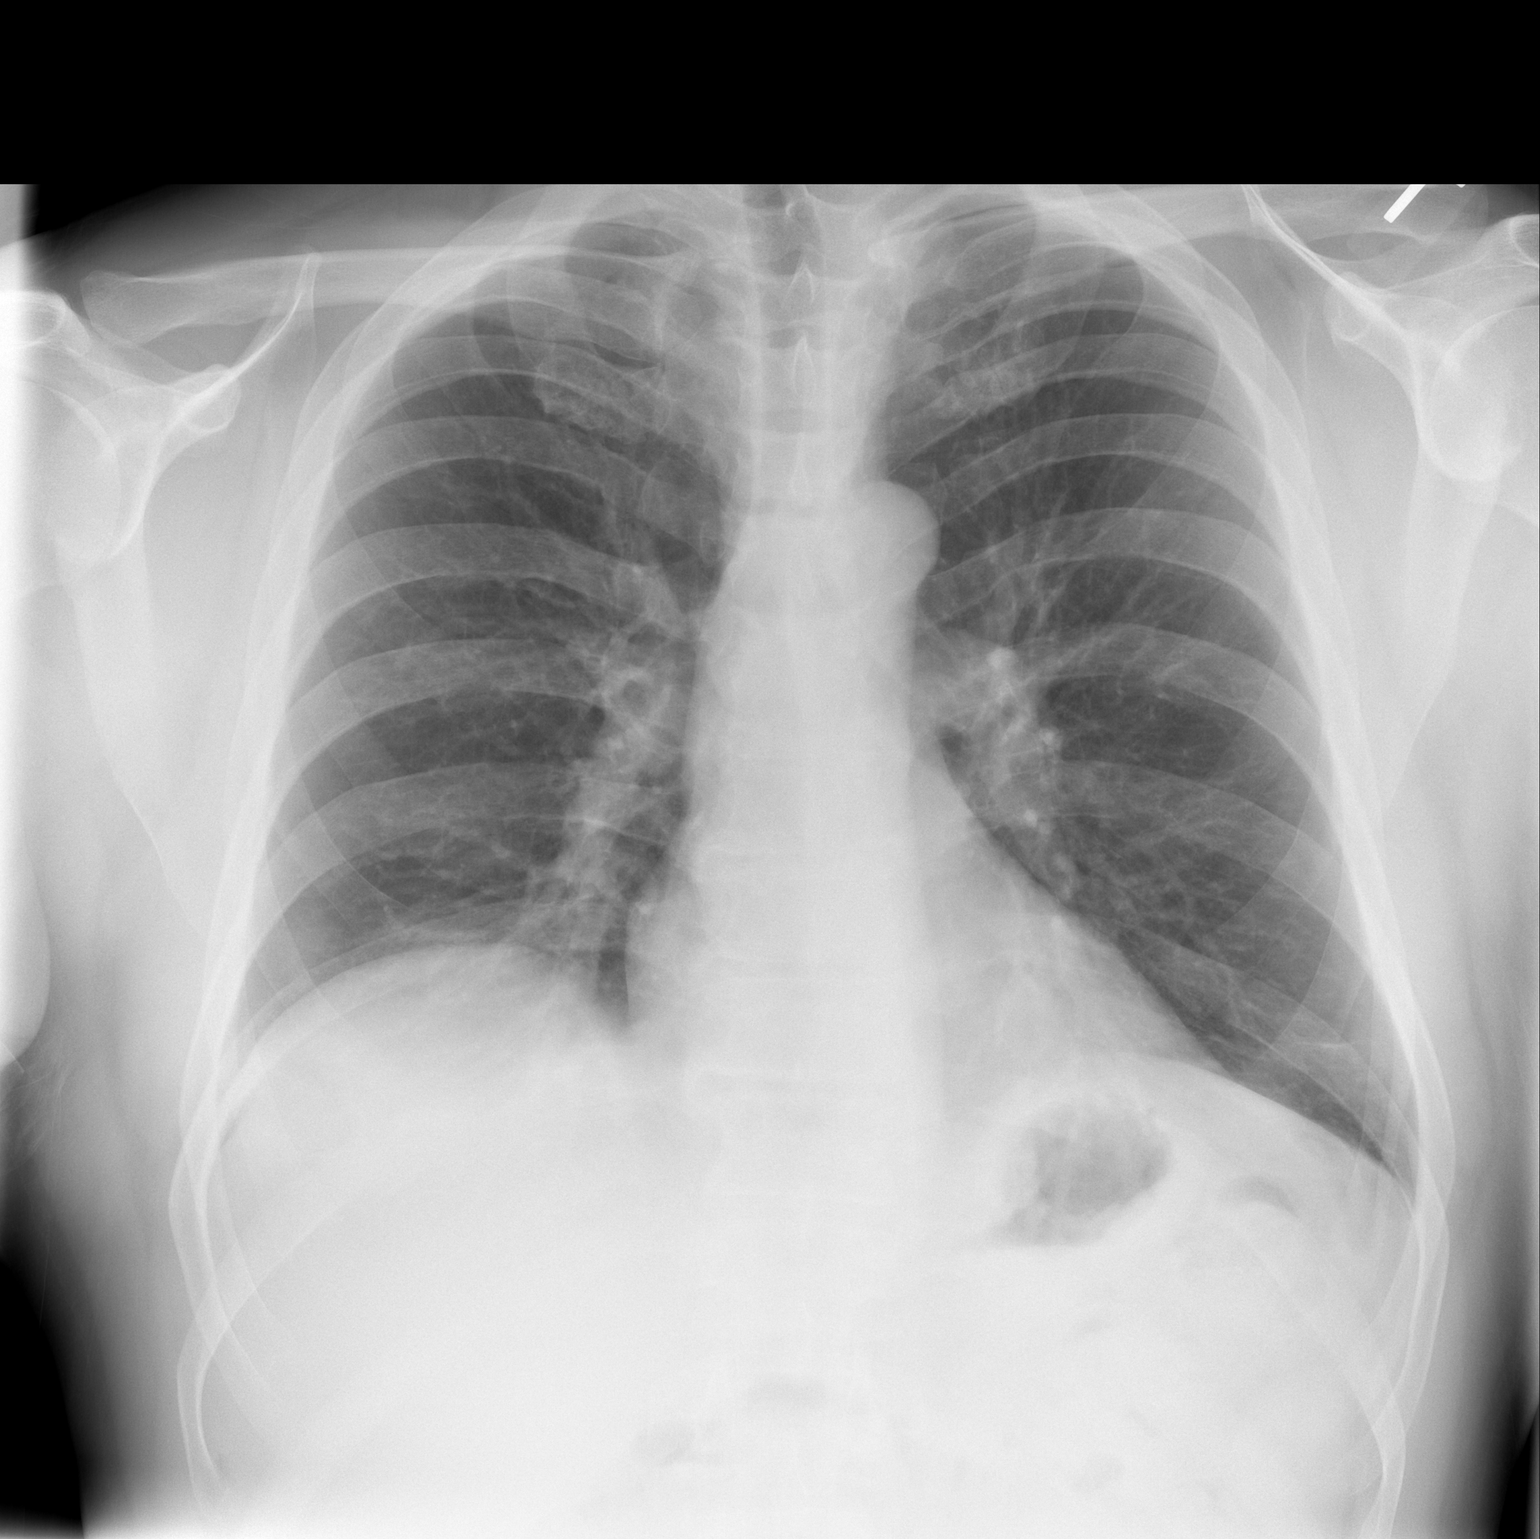

[w chest lat]
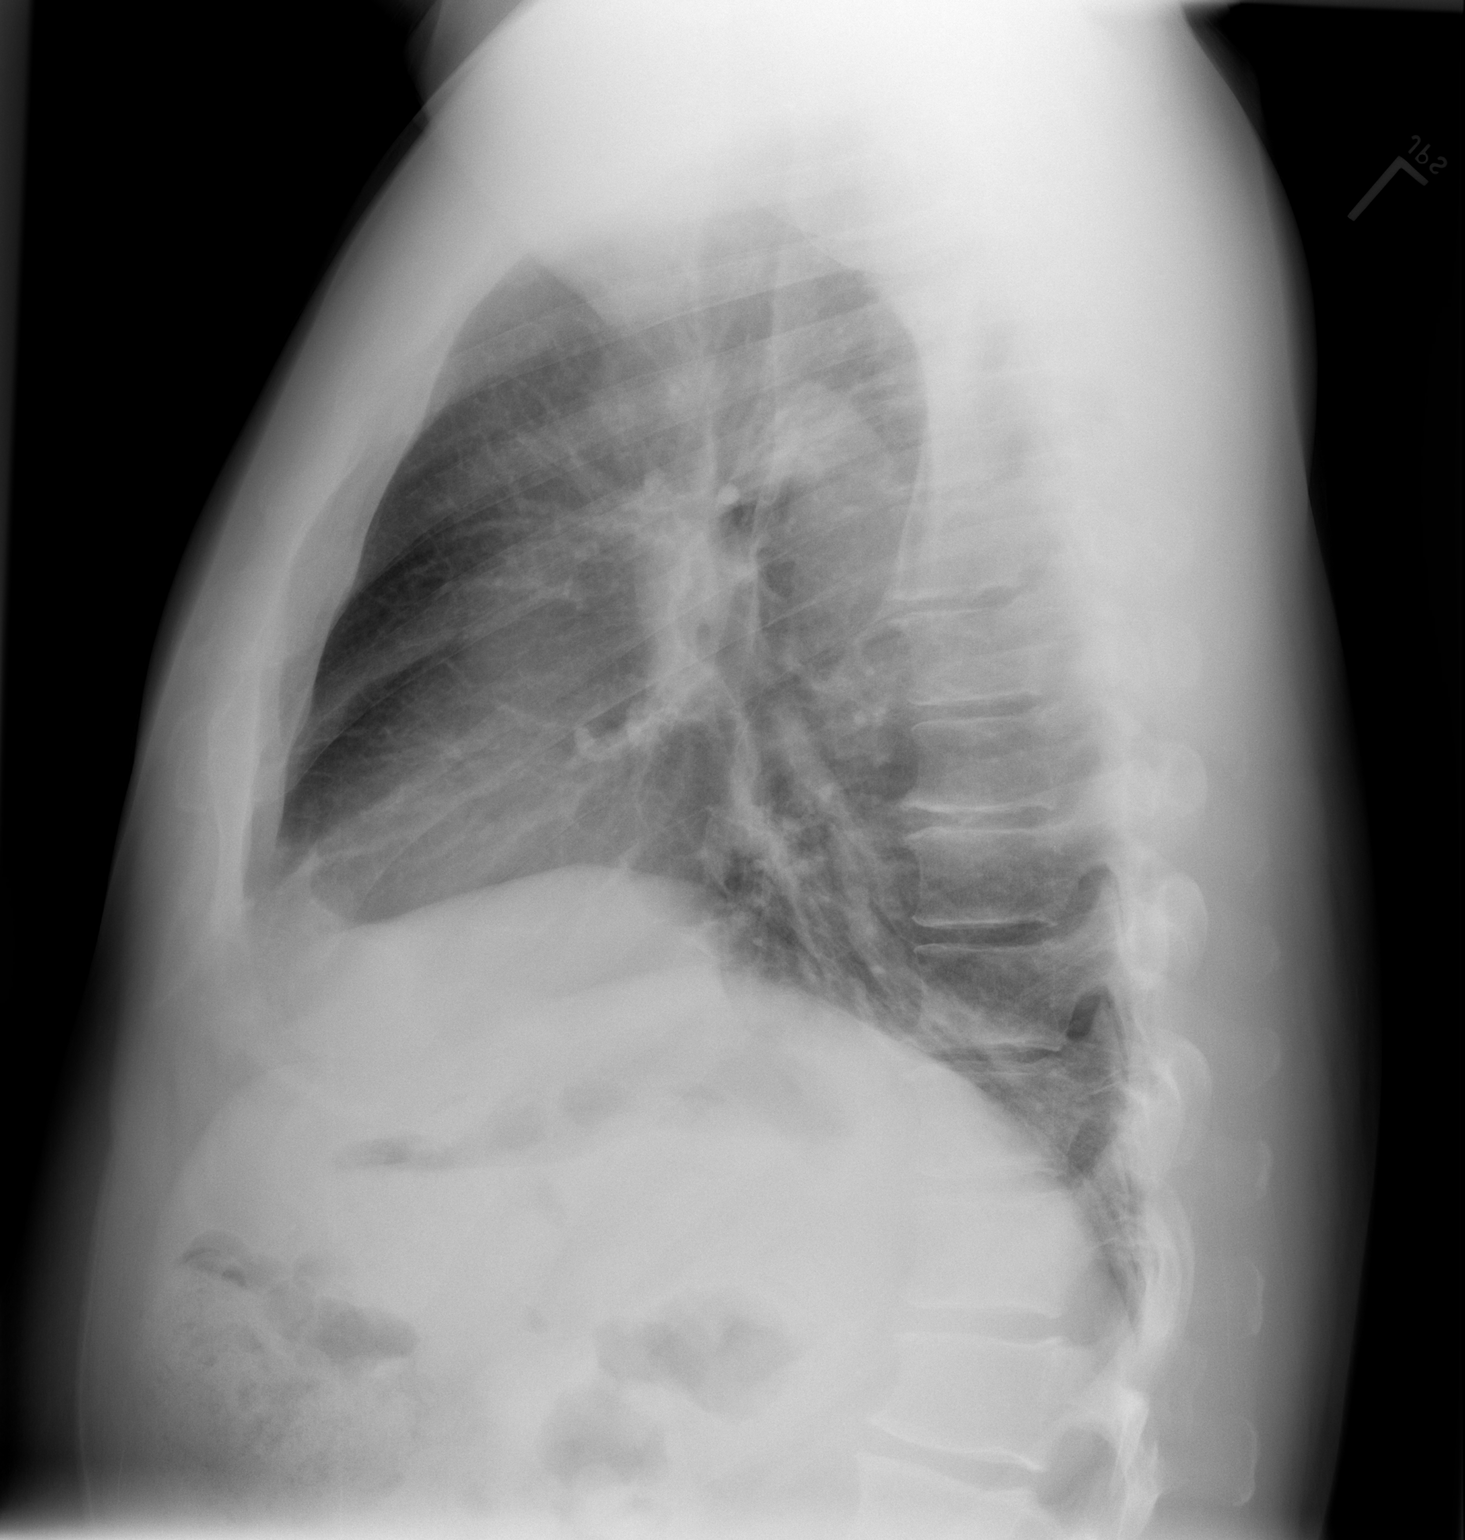

[2 of 2 positions shown; findings below may reference images not displayed]

FINDINGS: Cardiac shadow is stable. Minimal left basilar atelectasis is seen.
Improved aeration in the lingula is noted. There is now some right
lower lobe atelectasis identified. No bony abnormality is noted.
IMPRESSION: Interval clearing in the lingula. New right lower lobe atelectasis
is noted.

## 2015-04-29 ENCOUNTER — Encounter: Payer: Self-pay | Admitting: Internal Medicine

## 2015-04-29 ENCOUNTER — Ambulatory Visit: Payer: Self-pay | Admitting: Physician Assistant

## 2015-04-29 ENCOUNTER — Ambulatory Visit (INDEPENDENT_AMBULATORY_CARE_PROVIDER_SITE_OTHER): Payer: BLUE CROSS/BLUE SHIELD | Admitting: Internal Medicine

## 2015-04-29 VITALS — BP 138/88 | HR 68 | Temp 98.0°F | Resp 18 | Ht 77.0 in | Wt 255.0 lb

## 2015-04-29 DIAGNOSIS — I10 Essential (primary) hypertension: Secondary | ICD-10-CM | POA: Diagnosis not present

## 2015-04-29 DIAGNOSIS — F988 Other specified behavioral and emotional disorders with onset usually occurring in childhood and adolescence: Secondary | ICD-10-CM

## 2015-04-29 DIAGNOSIS — F909 Attention-deficit hyperactivity disorder, unspecified type: Secondary | ICD-10-CM | POA: Diagnosis not present

## 2015-04-29 DIAGNOSIS — Z23 Encounter for immunization: Secondary | ICD-10-CM

## 2015-04-29 MED ORDER — AMPHETAMINE-DEXTROAMPHETAMINE 20 MG PO TABS: ORAL_TABLET | ORAL | Status: DC

## 2015-04-29 MED ORDER — DIAZEPAM 2 MG PO TABS: ORAL_TABLET | ORAL | Status: DC

## 2015-04-29 NOTE — Progress Notes (Signed)
Patient ID: Cameron Donovan, male   DOB: [birth year], 39 y.o.   MRN: [id number]  Assessment and Plan:   1. Essential hypertension -cont enalapril -well controlled  2. ADD (attention deficit disorder)  - amphetamine-dextroamphetamine (ADDERALL) 20 MG tablet; Take 1/2 to 1 tablet 2 times daily as needed for ADD. Only take on work days & Rx will last 6 weeks  Dispense: 60 tablet; Refill: 0  3. Need for prophylactic vaccination and inoculation against influenza  - Flu vaccine > 3yo with preservative IM (Fluvirin Influenza Split)     HPI 39 y.o.male presents for 1 month follow up of blood pressure and starting enalapril. Patient reports that they have been doing well. He reports that he has not checked his BP at home but the symptoms that he was feeling lightheaded prior to the medication due to the high blood pressure. male is taking their medication.  They are not having difficulty with their medications.  They report only one day of dizziness when first starting the medication. No other adverse reactions.   No CP, SOB, leg swelling, vision okay.    He does feel that he has been    Past Medical History  Diagnosis Date  . ADD (attention deficit disorder)   . Hypertension   . Hypogonadism male   . Vitamin D deficiency   . Anxiety   . Thyroiditis      No Known Allergies    Current Outpatient Prescriptions on File Prior to Visit  Medication Sig Dispense Refill  . amphetamine-dextroamphetamine (ADDERALL) 20 MG tablet Take 1/2 to 1 tablet 2 times daily as needed for ADD. Only take on work days & Rx will last 6 weeks 60 tablet 0  . buPROPion (WELLBUTRIN XL) 300 MG 24 hr tablet Take 1 tablet (300 mg total) by mouth every morning. 30 tablet 2  . diazepam (VALIUM) 2 MG tablet 1/2-1 pill at night for sleep 30 tablet 0  . enalapril (VASOTEC) 10 MG tablet Take 1 tablet (10 mg total) by mouth daily. 30 tablet 11   No current facility-administered medications on file prior to visit.     ROS: all negative except above.   Physical Exam: Filed Weights   04/29/15 0906  Weight: 255 lb (115.667 kg)   BP 138/88 mmHg  Pulse 68  Temp(Src) 98 F (36.7 C) (Temporal)  Resp 18  Ht  (1.956 m)  Wt 255 lb (115.667 kg)  BMI 30.23 kg/m2 General Appearance: Well developed well nourished, non-toxic appearing in no apparent distress. Eyes: PERRLA, EOMs, conjunctiva w/ no swelling or erythema or discharge Sinuses: No Frontal/maxillary tenderness ENT/Mouth: Ear canals clear without swelling or erythema.  TM's normal bilaterally with no retractions, bulging, or loss of landmarks.   Neck: Supple, thyroid normal, no notable JVD  Respiratory: Respiratory effort normal, Clear breath sounds anteriorly and posteriorly bilaterally without rales, rhonchi, wheezing or stridor. No retractions or accessory muscle usage. Cardio: RRR with no MRGs.   Abdomen: Soft, + BS.  Non tender, no guarding, rebound, hernias, masses.  Musculoskeletal: Full ROM, 5/5 strength, normal gait.  Skin: Warm, dry without rashes  Neuro: Awake and oriented X 3, Cranial nerves intact. Normal muscle tone, no cerebellar symptoms. Sensation intact.  Psych: normal affect, Insight and Judgment appropriate.     Terri Piedra, PA-C 9:38 AM [facility]

## 2015-05-18 ENCOUNTER — Ambulatory Visit (INDEPENDENT_AMBULATORY_CARE_PROVIDER_SITE_OTHER): Payer: BLUE CROSS/BLUE SHIELD | Admitting: Internal Medicine

## 2015-05-18 ENCOUNTER — Encounter: Payer: Self-pay | Admitting: Internal Medicine

## 2015-05-18 VITALS — BP 140/98 | HR 60 | Temp 98.0°F | Resp 18 | Ht 77.0 in | Wt 265.0 lb

## 2015-05-18 DIAGNOSIS — J069 Acute upper respiratory infection, unspecified: Secondary | ICD-10-CM | POA: Diagnosis not present

## 2015-05-18 MED ORDER — PREDNISONE 20 MG PO TABS: ORAL_TABLET | ORAL | Status: DC

## 2015-05-18 MED ORDER — BENZONATATE 200 MG PO CAPS: 200.0000 mg | ORAL_CAPSULE | Freq: Three times a day (TID) | ORAL | Status: DC | PRN

## 2015-05-18 MED ORDER — PROMETHAZINE-CODEINE 6.25-10 MG/5ML PO SYRP: 5.0000 mL | ORAL_SOLUTION | Freq: Four times a day (QID) | ORAL | Status: DC | PRN

## 2015-05-18 MED ORDER — AZITHROMYCIN 250 MG PO TABS: ORAL_TABLET | ORAL | Status: DC

## 2015-05-18 NOTE — Patient Instructions (Signed)
Please take the zpak until it is gone.   Please take the prednisone until it is gone.  Please use either claritin, zyrtec, or allegra.  The store brand is okay to take.  This will help dry up mucous.  Take tylenol or ibuprofen for soreness in ribs.  Take phenergan codeine cough syrup every 4-6 hours as needed for cough and pain.  Take tessalon for cough during daytime if you cannot be sedated.

## 2015-05-18 NOTE — Progress Notes (Signed)
Patient ID: Cameron ChingJames Donovan, male   DOB: 1976/03/27, 39 y.o.   MRN: [id number]  HPI  Patient presents to the office for evaluation of cough.  It has been going on for 1 weeks.  Patient reports all the time, wet, not related to position, bringing up clear yellow sputum with coughing.  They also endorse change in voice, chest pain, chills, night sweats, postnasal drip, shortness of breath, wheezing and sinus congestion, rhinorrhea..  They have tried mucinex, tylenol.  They report that nothing has worked.  They denies other sick contacts.  Review of Systems  Constitutional: Negative for fever, chills and malaise/fatigue.  HENT: Positive for congestion. Negative for ear pain, nosebleeds and sore throat.   Respiratory: Positive for cough, shortness of breath and wheezing.   Cardiovascular: Positive for chest pain. Negative for palpitations and leg swelling.  Skin: Negative.   Neurological: Negative for headaches.    PE:  General:  Alert and non-toxic, WDWN, NAD HEENT: NCAT, PERLA, EOM normal, no occular discharge or erythema.  Nasal mucosal edema with sinus tenderness to palpation.  Oropharynx clear with minimal oropharyngeal edema and erythema.  Mucous membranes moist and pink. Neck:  Cervical adenopathy Chest:  RRR no MRGs.  Lungs clear to auscultation A&P with no rales, rhonchi which clears with coughing and end expiratory wheeze noted. Abdomen: +BS x 4 quadrants, soft, non-tender, no guarding, rigidity, or rebound. Skin: warm and dry no rash Neuro: A&Ox4, CN II-XII grossly intact  Assessment and Plan:   1. Acute URI -zpak -prednisone 2 week taper -zyrtec -phenergan codeine -tessalon -nasal saline

## 2015-05-30 ENCOUNTER — Other Ambulatory Visit: Payer: Self-pay | Admitting: *Deleted

## 2015-05-30 DIAGNOSIS — F988 Other specified behavioral and emotional disorders with onset usually occurring in childhood and adolescence: Secondary | ICD-10-CM

## 2015-05-30 MED ORDER — AMPHETAMINE-DEXTROAMPHETAMINE 20 MG PO TABS: ORAL_TABLET | ORAL | Status: DC

## 2015-05-30 MED ORDER — DIAZEPAM 2 MG PO TABS: ORAL_TABLET | ORAL | Status: DC

## 2015-06-30 ENCOUNTER — Other Ambulatory Visit: Payer: Self-pay | Admitting: Internal Medicine

## 2015-06-30 DIAGNOSIS — F988 Other specified behavioral and emotional disorders with onset usually occurring in childhood and adolescence: Secondary | ICD-10-CM

## 2015-06-30 DIAGNOSIS — G4700 Insomnia, unspecified: Secondary | ICD-10-CM

## 2015-06-30 MED ORDER — DIAZEPAM 2 MG PO TABS: ORAL_TABLET | ORAL | Status: DC

## 2015-06-30 MED ORDER — AMPHETAMINE-DEXTROAMPHETAMINE 20 MG PO TABS: ORAL_TABLET | ORAL | Status: AC

## 2015-08-08 ENCOUNTER — Ambulatory Visit: Payer: Self-pay | Admitting: Physician Assistant

## 2015-08-25 ENCOUNTER — Other Ambulatory Visit: Payer: Self-pay | Admitting: *Deleted

## 2015-08-25 MED ORDER — AMPHETAMINE-DEXTROAMPHETAMINE 20 MG PO TABS: 20.0000 mg | ORAL_TABLET | Freq: Two times a day (BID) | ORAL | Status: DC | PRN

## 2015-09-26 ENCOUNTER — Encounter: Payer: Self-pay | Admitting: Internal Medicine

## 2015-10-26 ENCOUNTER — Other Ambulatory Visit: Payer: Self-pay | Admitting: *Deleted

## 2015-10-26 MED ORDER — AMPHETAMINE-DEXTROAMPHETAMINE 20 MG PO TABS: 20.0000 mg | ORAL_TABLET | Freq: Two times a day (BID) | ORAL | Status: AC | PRN

## 2016-06-05 ENCOUNTER — Ambulatory Visit (INDEPENDENT_AMBULATORY_CARE_PROVIDER_SITE_OTHER): Payer: BLUE CROSS/BLUE SHIELD | Admitting: Internal Medicine

## 2016-06-05 ENCOUNTER — Encounter: Payer: Self-pay | Admitting: Internal Medicine

## 2016-06-05 VITALS — BP 146/86 | HR 68 | Temp 97.7°F | Resp 16 | Ht 77.0 in | Wt 281.8 lb

## 2016-06-05 DIAGNOSIS — Z79899 Other long term (current) drug therapy: Secondary | ICD-10-CM | POA: Diagnosis not present

## 2016-06-05 DIAGNOSIS — I10 Essential (primary) hypertension: Secondary | ICD-10-CM | POA: Diagnosis not present

## 2016-06-05 DIAGNOSIS — R7303 Prediabetes: Secondary | ICD-10-CM

## 2016-06-05 DIAGNOSIS — E559 Vitamin D deficiency, unspecified: Secondary | ICD-10-CM | POA: Diagnosis not present

## 2016-06-05 DIAGNOSIS — E782 Mixed hyperlipidemia: Secondary | ICD-10-CM

## 2016-06-05 LAB — CBC WITH DIFFERENTIAL/PLATELET
BASOS PCT: 1 %
Basophils Absolute: 98 cells/uL (ref 0–200)
EOS ABS: 98 {cells}/uL (ref 15–500)
Eosinophils Relative: 1 %
HEMATOCRIT: 42.3 % (ref 38.5–50.0)
HEMOGLOBIN: 14.1 g/dL (ref 13.2–17.1)
LYMPHS ABS: 1960 {cells}/uL (ref 850–3900)
Lymphocytes Relative: 20 %
MCH: 32.4 pg (ref 27.0–33.0)
MCHC: 33.3 g/dL (ref 32.0–36.0)
MCV: 97.2 fL (ref 80.0–100.0)
MONO ABS: 980 {cells}/uL — AB (ref 200–950)
MPV: 9.6 fL (ref 7.5–12.5)
Monocytes Relative: 10 %
NEUTROS ABS: 6664 {cells}/uL (ref 1500–7800)
Neutrophils Relative %: 68 %
Platelets: 308 10*3/uL (ref 140–400)
RBC: 4.35 MIL/uL (ref 4.20–5.80)
RDW: 13.2 % (ref 11.0–15.0)
WBC: 9.8 10*3/uL (ref 3.8–10.8)

## 2016-06-05 NOTE — Patient Instructions (Signed)

## 2016-06-05 NOTE — Progress Notes (Signed)
[facility] Cameron Donovan, M.D.        Dyanne CarrelAmanda R. Steffanie Dunnollier, P.A.-C       Terri Piedraourtney Forcucci, P.A.-C  [facility]                [address]                [place], [place] [id number] Telephone [phone] Telefax [phone] ______________________________________________________________________     This very nice 40 y.o. WWM presents for late follow up with Hypertension, Hyperlipidemia, Pre-Diabetes and Vitamin D Deficiency.      Patient is treated for HTN (2007) but has been off of meds & BP has been alleged controlled at home. Today's  BP 146/86 and is rechecked at 105/64. Patient has had no complaints of any cardiac type chest pain, palpitations, dyspnea/orthopnea/PND, dizziness, claudication, or dependent edema.     Hyperlipidemia is controlled with diet & meds. Patient denies myalgias or other med SE's. Last Lipids were  Lab Results  Component Value Date   CHOL 178 03/28/2015   HDL 58 03/28/2015   LDLCALC 77 03/28/2015   TRIG 215 (H) 03/28/2015   CHOLHDL 3.1 03/28/2015      Also, the patient has history of PreDiabetes with A1c 5.7% in June 2012 and has had no symptoms of reactive hypoglycemia, diabetic polys, paresthesias or visual blurring.  Last A1c was at goal: Lab Results  Component Value Date   HGBA1C 5.3 03/28/2015      Further, the patient also has history of Vitamin D Deficiency in 2013 of "39" and supplements vitamin D without any suspected side-effects. Last vitamin D was still low:   Lab Results  Component Value Date   VD25OH 30 03/28/2015   Patient has stopped all meds since his last OV   No Known Allergies   PMHx:   Past Medical History:  Diagnosis Date  . ADD (attention deficit disorder)   . Anxiety   . Hypertension   . Hypogonadism male   . Thyroiditis   . Vitamin D deficiency    Immunization History  Administered Date(s) Administered  . Influenza Split 04/29/2015  . PPD Test  09/23/2014  . Tdap 09/23/2014   FHx:    Reviewed / unchanged  SHx:    Reviewed / unchanged  Systems Review:  Constitutional: Denies fever, chills, wt changes, headaches, insomnia, fatigue, night sweats, change in appetite. Eyes: Denies redness, blurred vision, diplopia, discharge, itchy, watery eyes.  ENT: Denies discharge, congestion, post nasal drip, epistaxis, sore throat, earache, hearing loss, dental pain, tinnitus, vertigo, sinus pain, snoring.  CV: Denies chest pain, palpitations, irregular heartbeat, syncope, dyspnea, diaphoresis, orthopnea, PND, claudication or edema. Respiratory: denies cough, dyspnea, DOE, pleurisy, hoarseness, laryngitis, wheezing.  Gastrointestinal: Denies dysphagia, odynophagia, heartburn, reflux, water brash, abdominal pain or cramps, nausea, vomiting, bloating, diarrhea, constipation, hematemesis, melena, hematochezia  or hemorrhoids. Genitourinary: Denies dysuria, frequency, urgency, nocturia, hesitancy, discharge, hematuria or flank pain. Musculoskeletal: Denies arthralgias, myalgias, stiffness, jt. swelling, pain, limping or strain/sprain.  Skin: Denies pruritus, rash, hives, warts, acne, eczema or change in skin lesion(s). Neuro: No weakness, tremor, incoordination, spasms, paresthesia or pain. Psychiatric: Denies confusion, memory loss or sensory loss. Endo: Denies change in weight, skin or hair change.  Heme/Lymph: No excessive bleeding, bruising or enlarged lymph nodes.  Physical Exam BP (!) 146/86   Pulse 68   Temp 97.7 F (36.5 C)   Resp 16   Ht 6\' 5"  (1.956 m)   Wt  281 lb 12.8 oz (127.8 kg)   BMI 33.42 kg/m   Appears well nourished and in no distress.  Eyes: PERRLA, EOMs, conjunctiva no swelling or erythema. Sinuses: No frontal/maxillary tenderness ENT/Mouth: EAC's clear, TM's nl w/o erythema, bulging. Nares clear w/o erythema, swelling, exudates. Oropharynx clear without erythema or exudates. Oral hygiene is good. Tongue normal, non  obstructing. Hearing intact.  Neck: Supple. Thyroid nl. Car 2+/2+ without bruits, nodes or JVD. Chest: Respirations nl with BS clear & equal w/o rales, rhonchi, wheezing or stridor.  Cor: Heart sounds normal w/ regular rate and rhythm without sig. murmurs, gallops, clicks, or rubs. Peripheral pulses normal and equal  without edema.  Abdomen: Soft & bowel sounds normal. Non-tender w/o guarding, rebound, hernias, masses, or organomegaly.  Lymphatics: Unremarkable.  Musculoskeletal: Full ROM all peripheral extremities, joint stability, 5/5 strength, and normal gait.  Skin: Warm, dry without exposed rashes, lesions or ecchymosis apparent.  Neuro: Cranial nerves intact, reflexes equal bilaterally. Sensory-motor testing grossly intact. Tendon reflexes grossly intact.  Pysch: Alert & oriented x 3.  Insight and judgement nl & appropriate. No ideations.  Assessment and Plan:  1. Essential hypertension  - Continue medication, monitor blood pressure at home.  - Continue DASH diet. Reminder to go to the ER if any CP,  - SOB, nausea, dizziness, severe HA, changes vision/speech,  left arm numbness and tingling and jaw pain. - TSH  2. Hyperlipidemia  - Continue diet/meds, exercise,& lifestyle modifications.  - Continue monitor periodic cholesterol/liver & renal functions  - Lipid panel - TSH  3. Prediabetes  - Continue diet, exercise, lifestyle modifications.  - Monitor appropriate labs. - Hemoglobin A1c - Insulin, random  4. Vitamin D deficiency  - Continue supplementation. - VITAMIN D 25 Hydroxy  5. Medication management  - CBC with Differential/Platelet - BASIC METABOLIC PANEL WITH GFR - Hepatic function panel - Magnesium      Recommended regular exercise, BP monitoring, weight control, and discussed med and SE's. Recommended labs to assess and monitor clinical status. Further disposition pending results of labs. Over 30 minutes of exam, counseling, chart review was performed.

## 2016-06-06 LAB — INSULIN, RANDOM: INSULIN: 8.1 u[IU]/mL (ref 2.0–19.6)

## 2016-06-06 LAB — HEPATIC FUNCTION PANEL
ALBUMIN: 4.8 g/dL (ref 3.6–5.1)
ALK PHOS: 58 U/L (ref 40–115)
ALT: 46 U/L (ref 9–46)
AST: 35 U/L (ref 10–40)
BILIRUBIN TOTAL: 0.7 mg/dL (ref 0.2–1.2)
Bilirubin, Direct: 0.2 mg/dL (ref <=0.2)
Indirect Bilirubin: 0.5 mg/dL (ref 0.2–1.2)
TOTAL PROTEIN: 7.2 g/dL (ref 6.1–8.1)

## 2016-06-06 LAB — LIPID PANEL
Cholesterol: 185 mg/dL (ref <200)
HDL: 62 mg/dL (ref >40)
LDL CALC: 114 mg/dL — AB
Total CHOL/HDL Ratio: 3 Ratio (ref <5.0)
Triglycerides: 45 mg/dL (ref <150)
VLDL: 9 mg/dL (ref <30)

## 2016-06-06 LAB — BASIC METABOLIC PANEL WITH GFR
BUN: 9 mg/dL (ref 7–25)
CHLORIDE: 99 mmol/L (ref 98–110)
CO2: 25 mmol/L (ref 20–31)
Calcium: 9.8 mg/dL (ref 8.6–10.3)
Creat: 1.06 mg/dL (ref 0.60–1.35)
GFR, Est African American: >89 mL/min (ref >=60)
GFR, Est Non African American: 86 mL/min (ref >=60)
GLUCOSE: 88 mg/dL (ref 65–99)
POTASSIUM: 4.3 mmol/L (ref 3.5–5.3)
Sodium: 137 mmol/L (ref 135–146)

## 2016-06-06 LAB — HEMOGLOBIN A1C
HEMOGLOBIN A1C: 5.2 % (ref <5.7)
MEAN PLASMA GLUCOSE: 103 mg/dL

## 2016-06-06 LAB — TSH: TSH: 1.2 m[IU]/L (ref 0.40–4.50)

## 2016-06-06 LAB — VITAMIN D 25 HYDROXY (VIT D DEFICIENCY, FRACTURES): VIT D 25 HYDROXY: 30 ng/mL (ref 30–100)

## 2016-06-06 LAB — MAGNESIUM: Magnesium: 1.8 mg/dL (ref 1.5–2.5)

## 2016-09-06 ENCOUNTER — Ambulatory Visit: Payer: Self-pay | Admitting: Physician Assistant

## 2016-09-06 NOTE — Progress Notes (Deleted)
Assessment and Plan:  1. Hypertension -start on enalapril 20mg , monitor blood pressure at home. Continue DASH diet.  Reminder to go to the ER if any CP, SOB, nausea, dizziness, severe HA, changes vision/speech, left arm numbness and tingling and jaw pain.  2. Cholesterol -Continue diet and exercise. Check cholesterol.   3. Prediabetes  -Continue diet and exercise. Check A1C  4. Vitamin D Def - check level and continue medications.   5. Depression/anxiety Valium 1 mg, 1/2 - 1 at night for sleep Continue wellbutrin Suggest counseling  Follow up 1 month to recheck BP and talk about depression/sleep  Continue diet and meds as discussed. Further disposition pending results of labs. Over 30 minutes of exam, counseling, chart review, and critical decision making was performed  HPI 41 y.o. recently widowed white male  presents for 3 month follow up on hypertension, cholesterol, prediabetes, and vitamin D deficiency.   His blood pressure has not been controlled at home, today their BP is  , rechecked 170/90.  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   He does workout. He denies chest pain, shortness of breath, dizziness.  He is not on cholesterol medication and denies myalgias. His cholesterol is at goal. The cholesterol last visit was:   Lab Results  Component Value Date   CHOL 185 06/05/2016   HDL 62 06/05/2016   LDLCALC 114 (H) 06/05/2016   TRIG 45 06/05/2016   CHOLHDL 3.0 06/05/2016    He has been working on diet and exercise for prediabetes, A1C was 5.7 in 2012, and denies paresthesia of the feet, polydipsia, polyuria and visual disturbances. Last A1C in the office was:  Lab Results  Component Value Date   HGBA1C 5.2 06/05/2016   Patient is on Vitamin D supplement.   Lab Results  Component Value Date   VD25OH 30 06/05/2016     Patient is on an ADD medication, he states that the medication is helping and he denies any adverse reactions. He is on 1/2- 2 pills a  day.  He is still in mourning from his wife April passing in Dec, and his father is not in good health. He admits to being depressed, has trouble sleeping, no SI/HI.   Current Medications:  No current outpatient prescriptions on file prior to visit.   No current facility-administered medications on file prior to visit.    Medical History:  Past Medical History:  Diagnosis Date  . ADD (attention deficit disorder)   . Anxiety   . Hypertension   . Hypogonadism male   . Thyroiditis   . Vitamin D deficiency    Allergies: No Known Allergies   Review of Systems:  Review of Systems  Constitutional: Negative.   HENT: Negative.   Eyes: Negative.   Respiratory: Negative.   Cardiovascular: Negative.   Gastrointestinal: Negative.   Genitourinary: Negative.   Musculoskeletal: Positive for neck pain. Negative for back pain, falls, joint pain and myalgias.  Skin: Negative.   Neurological: Negative.   Endo/Heme/Allergies: Negative.   Psychiatric/Behavioral: Positive for depression. Negative for hallucinations, memory loss, substance abuse and suicidal ideas. The patient has insomnia (will wake up 1-2 x a night. ). The patient is not nervous/anxious.     Family history- Review and unchanged Social history- Review and unchanged Physical Exam: There were no vitals taken for this visit. Wt Readings from Last 3 Encounters:  06/05/16 281 lb 12.8 oz (127.8 kg)  05/18/15 265 lb (120.2 kg)  04/29/15 255  lb (115.7 kg)   General Appearance: Well nourished, in no apparent distress. Eyes: PERRLA, EOMs, conjunctiva no swelling or erythema Sinuses: No Frontal/maxillary tenderness ENT/Mouth: Ext aud canals clear, TMs without erythema, bulging. No erythema, swelling, or exudate on post pharynx.  Tonsils not swollen or erythematous. Hearing normal.  Neck: Supple, thyroid normal.  Respiratory: Respiratory effort normal, BS equal bilaterally without rales, rhonchi, wheezing or stridor.  Cardio: RRR  with no MRGs. Brisk peripheral pulses without edema.  Abdomen: Soft, + BS,  Non tender, no guarding, rebound, hernias, masses. Lymphatics: Non tender without lymphadenopathy.  Musculoskeletal: Full ROM, 5/5 strength, Normal gait Skin: Warm, dry without rashes, lesions, ecchymosis.  Neuro: Cranial nerves intact. Normal muscle tone, no cerebellar symptoms. Psych: Awake and oriented X 3, normal affect, Insight and Judgment appropriate.    Quentin MullingAmanda Chauncey Bruno, PA-C 1:41 PM [facility]

## 2016-09-10 ENCOUNTER — Ambulatory Visit: Payer: Self-pay | Admitting: Physician Assistant

## 2016-10-31 ENCOUNTER — Encounter: Payer: Self-pay | Admitting: Internal Medicine

## 2017-01-10 ENCOUNTER — Ambulatory Visit (INDEPENDENT_AMBULATORY_CARE_PROVIDER_SITE_OTHER): Payer: BLUE CROSS/BLUE SHIELD | Admitting: Internal Medicine

## 2017-01-10 ENCOUNTER — Encounter: Payer: Self-pay | Admitting: Internal Medicine

## 2017-01-10 VITALS — BP 122/95 | HR 80 | Temp 97.5°F | Resp 16 | Ht 77.0 in | Wt 274.0 lb

## 2017-01-10 DIAGNOSIS — E782 Mixed hyperlipidemia: Secondary | ICD-10-CM

## 2017-01-10 DIAGNOSIS — Z0000 Encounter for general adult medical examination without abnormal findings: Secondary | ICD-10-CM

## 2017-01-10 DIAGNOSIS — E559 Vitamin D deficiency, unspecified: Secondary | ICD-10-CM

## 2017-01-10 DIAGNOSIS — Z0001 Encounter for general adult medical examination with abnormal findings: Secondary | ICD-10-CM

## 2017-01-10 DIAGNOSIS — Z1211 Encounter for screening for malignant neoplasm of colon: Secondary | ICD-10-CM

## 2017-01-10 DIAGNOSIS — Z125 Encounter for screening for malignant neoplasm of prostate: Secondary | ICD-10-CM | POA: Diagnosis not present

## 2017-01-10 DIAGNOSIS — Z136 Encounter for screening for cardiovascular disorders: Secondary | ICD-10-CM

## 2017-01-10 DIAGNOSIS — F988 Other specified behavioral and emotional disorders with onset usually occurring in childhood and adolescence: Secondary | ICD-10-CM

## 2017-01-10 DIAGNOSIS — R7303 Prediabetes: Secondary | ICD-10-CM

## 2017-01-10 DIAGNOSIS — Z79899 Other long term (current) drug therapy: Secondary | ICD-10-CM

## 2017-01-10 DIAGNOSIS — R5383 Other fatigue: Secondary | ICD-10-CM

## 2017-01-10 DIAGNOSIS — Z1212 Encounter for screening for malignant neoplasm of rectum: Secondary | ICD-10-CM

## 2017-01-10 DIAGNOSIS — I10 Essential (primary) hypertension: Secondary | ICD-10-CM

## 2017-01-10 LAB — CBC WITH DIFFERENTIAL/PLATELET
BASOS ABS: 104 {cells}/uL (ref 0–200)
BASOS PCT: 1 %
EOS ABS: 416 {cells}/uL (ref 15–500)
Eosinophils Relative: 4 %
HCT: 53.4 % — ABNORMAL HIGH (ref 38.5–50.0)
HEMOGLOBIN: 18.6 g/dL — AB (ref 13.2–17.1)
LYMPHS ABS: 1976 {cells}/uL (ref 850–3900)
Lymphocytes Relative: 19 %
MCH: 33.1 pg — AB (ref 27.0–33.0)
MCHC: 34.8 g/dL (ref 32.0–36.0)
MCV: 95 fL (ref 80.0–100.0)
MONOS PCT: 11 %
MPV: 9.8 fL (ref 7.5–12.5)
Monocytes Absolute: 1144 cells/uL — ABNORMAL HIGH (ref 200–950)
NEUTROS ABS: 6760 {cells}/uL (ref 1500–7800)
Neutrophils Relative %: 65 %
PLATELETS: 345 10*3/uL (ref 140–400)
RBC: 5.62 MIL/uL (ref 4.20–5.80)
RDW: 13 % (ref 11.0–15.0)
WBC: 10.4 10*3/uL (ref 3.8–10.8)

## 2017-01-10 LAB — BASIC METABOLIC PANEL WITH GFR
BUN: 12 mg/dL (ref 7–25)
CHLORIDE: 98 mmol/L (ref 98–110)
CO2: 23 mmol/L (ref 20–31)
CREATININE: 1.07 mg/dL (ref 0.60–1.35)
Calcium: 10.1 mg/dL (ref 8.6–10.3)
GFR, Est African American: >89 mL/min (ref >=60)
GFR, Est Non African American: 85 mL/min (ref >=60)
Glucose, Bld: 101 mg/dL — ABNORMAL HIGH (ref 65–99)
POTASSIUM: 4.8 mmol/L (ref 3.5–5.3)
SODIUM: 135 mmol/L (ref 135–146)

## 2017-01-10 LAB — IRON AND TIBC
%SAT: 70 % — ABNORMAL HIGH (ref 15–60)
IRON: 243 ug/dL — AB (ref 50–180)
TIBC: 346 ug/dL (ref 250–425)
UIBC: 103 ug/dL

## 2017-01-10 LAB — LIPID PANEL
CHOL/HDL RATIO: 4 ratio (ref <5.0)
Cholesterol: 241 mg/dL — ABNORMAL HIGH (ref <200)
HDL: 60 mg/dL (ref >40)
LDL Cholesterol: 154 mg/dL — ABNORMAL HIGH (ref <100)
Triglycerides: 134 mg/dL (ref <150)
VLDL: 27 mg/dL (ref <30)

## 2017-01-10 LAB — HEPATIC FUNCTION PANEL
ALT: 44 U/L (ref 9–46)
AST: 30 U/L (ref 10–40)
Albumin: 5 g/dL (ref 3.6–5.1)
Alkaline Phosphatase: 70 U/L (ref 40–115)
BILIRUBIN DIRECT: 0.2 mg/dL (ref <=0.2)
Indirect Bilirubin: 0.7 mg/dL (ref 0.2–1.2)
TOTAL PROTEIN: 7.9 g/dL (ref 6.1–8.1)
Total Bilirubin: 0.9 mg/dL (ref 0.2–1.2)

## 2017-01-10 LAB — TSH: TSH: 1.21 m[IU]/L (ref 0.40–4.50)

## 2017-01-10 MED ORDER — BISOPROLOL-HYDROCHLOROTHIAZIDE 10-6.25 MG PO TABS: ORAL_TABLET | ORAL | 1 refills | Status: DC

## 2017-01-10 NOTE — Patient Instructions (Signed)

## 2017-01-10 NOTE — Progress Notes (Signed)
[facility]   Cameron Donovan, M.D.      Dyanne CarrelAmanda R. Steffanie Dunnollier, P.A.-C [facility]                [address]                [place], [place] [id number] Telephone [phone]) [phone] Telefax [phone] Annual  Screening/Preventative Visit  & Comprehensive Evaluation & Examination     This very nice 41 y.o. WWM presents for a Screening/Preventative Visit & comprehensive evaluation and management of multiple medical co-morbidities.  Patient has been followed for HTN, T2_NIDDM  Prediabetes, Hyperlipidemia and Vitamin D Deficiency.     HTN predates since 2007. Patient's BP has been controlled at home.  Today's BP was 122/95 and re-checked x 2 at 185/110. Patient denies any cardiac symptoms as chest pain, palpitations, shortness of breath, dizziness or ankle swelling.     Patient's hyperlipidemia is controlled with diet and medications. Patient denies myalgias or other medication SE's. Last lipids were not at goal:  Lab Results  Component Value Date   CHOL 185 06/05/2016   HDL 62 06/05/2016   LDLCALC 114 (H) 06/05/2016   TRIG 45 06/05/2016   CHOLHDL 3.0 06/05/2016      Patient has prediabetes (A1c 5.7% in 2012) and patient denies reactive hypoglycemic symptoms, visual blurring, diabetic polys or paresthesias. Last A1c was at goal: Lab Results  Component Value Date   HGBA1C 5.2 06/05/2016       Finally, patient has history of Vitamin D Deficiency ("39" in 2012) and last vitamin D was still very low: Lab Results  Component Value Date   VD25OH 30 06/05/2016   No current outpatient prescriptions on file prior to visit.   No current facility-administered medications on file prior to visit.    No Known Allergies   Past Medical History:  Diagnosis Date  . ADD (attention deficit disorder)   . Anxiety   . Hypertension   . Hypogonadism male   . Thyroiditis   . Vitamin D deficiency    Health Maintenance  Topic Date Due  .  HIV Screening  03/17/1989  . INFLUENZA VACCINE  02/27/2017  . TETANUS/TDAP  09/23/2024   Immunization History  Administered Date(s) Administered  . Influenza Split 04/29/2015  . PPD Test 09/23/2014  . Tdap 09/23/2014   Social History  . Marital status: Married    Spouse name: N/A  . Number of children: N/A  . Years of education: N/A   Occupational History  . Welder   Social History Main Topics  . Smoking status: Current Some Day Smoker    Types: Cigarettes    Start date: 06/03/2010  . Smokeless tobacco: Current User    Types: Chew  . Alcohol use 3.6 oz/week    6 Cans of beer per week  . Drug use: Yes    Frequency: 1.0 time per week    Types: Marijuana  . Sexual activity: Yes    Partners: Female    ROS Constitutional: Denies fever, chills, weight loss/gain, headaches, insomnia,  night sweats or change in appetite. Does c/o fatigue. Eyes: Denies redness, blurred vision, diplopia, discharge, itchy or watery eyes.  ENT: Denies discharge, congestion, post nasal drip, epistaxis, sore throat, earache, hearing loss, dental pain, Tinnitus, Vertigo, Sinus pain or snoring.  Cardio: Denies chest pain, palpitations, irregular heartbeat, syncope, dyspnea, diaphoresis, orthopnea, PND, claudication or edema Respiratory: denies cough, dyspnea, DOE, pleurisy, hoarseness, laryngitis or wheezing.  Gastrointestinal: Denies dysphagia, heartburn, reflux, water brash, pain, cramps, nausea, vomiting, bloating, diarrhea, constipation, hematemesis, melena, hematochezia, jaundice or hemorrhoids Genitourinary: Denies dysuria, frequency, urgency, nocturia, hesitancy, discharge, hematuria or flank pain Musculoskeletal: Denies arthralgia, myalgia, stiffness, Jt. Swelling, pain, limp or strain/sprain. Denies Falls. Skin: Denies puritis, rash, hives, warts, acne, eczema or change in skin lesion Neuro: No weakness, tremor, incoordination, spasms, paresthesia or pain Psychiatric: Denies confusion, memory loss  or sensory loss. Denies Depression. Endocrine: Denies change in weight, skin, hair change, nocturia, and paresthesia, diabetic polys, visual blurring or hyper / hypo glycemic episodes.  Heme/Lymph: No excessive bleeding, bruising or enlarged lymph nodes.  Physical Exam  BP (!) 122/95   Pulse 80   Temp 97.5 F (36.4 C)   Resp 16   Ht 6\' 5"  (1.956 m)   Wt 274 lb (124.3 kg)   BMI 32.49 kg/m   General Appearance: Well nourished and well groomed and in no apparent distress.  Eyes: PERRLA, EOMs, conjunctiva no swelling or erythema, normal fundi and vessels. Sinuses: No frontal/maxillary tenderness ENT/Mouth: EACs patent / TMs  nl. Nares clear without erythema, swelling, mucoid exudates. Oral hygiene is good. No erythema, swelling, or exudate. Tongue normal, non-obstructing. Tonsils not swollen or erythematous. Hearing normal.  Neck: Supple, thyroid normal. No bruits, nodes or JVD. Respiratory: Respiratory effort normal.  BS equal and clear bilateral without rales, rhonci, wheezing or stridor. Cardio: Heart sounds are normal with regular rate and rhythm and no murmurs, rubs or gallops. Peripheral pulses are normal and equal bilaterally without edema. No aortic or femoral bruits. Chest: symmetric with normal excursions and percussion.  Abdomen: Soft, with Nl bowel sounds. Nontender, no guarding, rebound, hernias, masses, or organomegaly.  Lymphatics: Non tender without lymphadenopathy.  Genitourinary: No hernias.Testes nl. DRE - prostate nl for age - smooth & firm w/o nodules. Musculoskeletal: Full ROM all peripheral extremities, joint stability, 5/5 strength, and normal gait. Skin: Warm and dry without rashes, lesions, cyanosis, clubbing or  ecchymosis.  Neuro: Cranial nerves intact, reflexes equal bilaterally. Normal muscle tone, no cerebellar symptoms. Sensation intact.  Pysch: Alert and oriented X 3 with normal affect, insight and judgment appropriate.   Assessment and Plan  1. Annual  Preventative/Screening Exam   2. Essential hypertension  - EKG 12-Lead - Urinalysis, Routine w reflex microscopic - Magnesium - TSH  3. Hyperlipidemia, mixed  - EKG 12-Lead - Hepatic function panel - Lipid panel - TSH  4. Prediabetes  - EKG 12-Lead - Hemoglobin A1c - Insulin, random  5. Vitamin D deficiency  - VITAMIN D 25 Hydroxy   6. Attention deficit disorder   7. Screening for colorectal cancer  - POC Hemoccult Bld/Stl  8. Screening for ischemic heart disease   9. Medication management  - Urinalysis, Routine w reflex microscopic - CBC with Differential/Platelet - BASIC METABOLIC PANEL WITH GFR - Hepatic function panel - Magnesium - Lipid panel - TSH - Hemoglobin A1c - Insulin, random - VITAMIN D 25 Hydroxy  10. Fatigue  - Vitamin B12 - Iron and TIBC - CBC with Differential/Platelet  11. Prostate cancer screening  - PSA      Patient was counseled in prudent diet, weight control to achieve/maintain BMI less than 25, BP monitoring, regular exercise and medications as discussed.  Discussed med effects and SE's. Routine screening labs and tests as requested with regular follow-up as recommended. Over 40 minutes of exam, counseling, chart review and high complex critical decision making was performed

## 2017-01-11 LAB — HEMOGLOBIN A1C
HEMOGLOBIN A1C: 5.3 % (ref <5.7)
Mean Plasma Glucose: 105 mg/dL

## 2017-01-11 LAB — MAGNESIUM: MAGNESIUM: 2.1 mg/dL (ref 1.5–2.5)

## 2017-01-11 LAB — INSULIN, RANDOM: Insulin: 11.8 u[IU]/mL (ref 2.0–19.6)

## 2017-01-11 LAB — VITAMIN B12: VITAMIN B 12: 312 pg/mL (ref 200–1100)

## 2017-01-11 LAB — URINALYSIS, ROUTINE W REFLEX MICROSCOPIC
BILIRUBIN URINE: NEGATIVE
Glucose, UA: NEGATIVE
HGB URINE DIPSTICK: NEGATIVE
Leukocytes, UA: NEGATIVE
NITRITE: NEGATIVE
PROTEIN: NEGATIVE
Specific Gravity, Urine: 1.014 (ref 1.001–1.035)
pH: 5.5 (ref 5.0–8.0)

## 2017-01-11 LAB — PSA: PSA: 0.5 ng/mL (ref <=4.0)

## 2017-01-11 LAB — VITAMIN D 25 HYDROXY (VIT D DEFICIENCY, FRACTURES): VIT D 25 HYDROXY: 25 ng/mL — AB (ref 30–100)

## 2017-02-01 ENCOUNTER — Ambulatory Visit (INDEPENDENT_AMBULATORY_CARE_PROVIDER_SITE_OTHER): Payer: BLUE CROSS/BLUE SHIELD | Admitting: Internal Medicine

## 2017-02-01 VITALS — BP 158/96 | HR 72 | Temp 97.3°F | Resp 16 | Ht 77.0 in | Wt 275.4 lb

## 2017-02-01 DIAGNOSIS — I10 Essential (primary) hypertension: Secondary | ICD-10-CM | POA: Diagnosis not present

## 2017-02-02 ENCOUNTER — Encounter: Payer: Self-pay | Admitting: Internal Medicine

## 2017-02-02 MED ORDER — LISINOPRIL 40 MG PO TABS: ORAL_TABLET | ORAL | 1 refills | Status: DC

## 2017-02-02 NOTE — Progress Notes (Signed)
  Subjective:    Patient ID: Cameron Donovan, male    DOB: May 04, 1976, 41 y.o.   MRN: [id number]  HPI  This nice 41 yo WWM with HTN was started 1 month ago on Ziac-10 for BP's of 185/110 and he  reports BP's checked of the last month are improved but still running in the 140-150's / 90's/. He disavows and sx's of HA, dizziness, CP, palpitations or edema.   Medication Sig  . bisoprolol-hctz [facility](ZIAC) 10-6.25  Take 1 tablet daily for BP   No Known Allergies   Past Medical History:  Diagnosis Date  . ADD (attention deficit disorder)   . Anxiety   . Hypertension   . Hypogonadism male   . Thyroiditis   . Vitamin D deficiency    Review of Systems  10 point systems review negative except as above.    Objective:   Physical Exam  BP (!) 158/96   Pulse 72   Temp (!) 97.3 F (36.3 C)   Resp 16   Ht 6\' 5"  (1.956 m)   Wt 275 lb 6.4 oz (124.9 kg)   BMI 32.66 kg/m   HEENT - WNL. Neck - supple.  Chest - Clear equal BS. Cor - Nl HS. RRR w/o sig MGR. PP 1(+). No edema. MS- FROM w/o deformities.  Gait Nl. Neuro -  Nl w/o focal abnormalities.    Assessment & Plan:   1. Essential hypertension  - Add Rx - lisinopril 40 MG tab; Take 1/2-1 tab daily for BP  Disp: 90 tab; Rf: 1  - discussed meds/SE's

## 2017-03-07 ENCOUNTER — Ambulatory Visit: Payer: Self-pay | Admitting: Internal Medicine

## 2017-06-24 ENCOUNTER — Encounter: Payer: Self-pay | Admitting: Physician Assistant

## 2017-06-24 ENCOUNTER — Ambulatory Visit: Payer: BLUE CROSS/BLUE SHIELD | Admitting: Physician Assistant

## 2017-06-24 VITALS — BP 130/80 | HR 63 | Temp 97.6°F | Resp 20 | Ht 77.0 in | Wt 286.4 lb

## 2017-06-24 DIAGNOSIS — R059 Cough, unspecified: Secondary | ICD-10-CM

## 2017-06-24 DIAGNOSIS — R05 Cough: Secondary | ICD-10-CM | POA: Diagnosis not present

## 2017-06-24 MED ORDER — PREDNISONE 20 MG PO TABS: ORAL_TABLET | ORAL | 0 refills | Status: DC

## 2017-06-24 MED ORDER — BENZONATATE 100 MG PO CAPS: 100.0000 mg | ORAL_CAPSULE | Freq: Three times a day (TID) | ORAL | 0 refills | Status: DC | PRN

## 2017-06-24 MED ORDER — AZITHROMYCIN 250 MG PO TABS: ORAL_TABLET | ORAL | 1 refills | Status: AC

## 2017-06-24 NOTE — Progress Notes (Signed)
Subjective:    Patient ID: Cameron Donovan, male    DOB: July 23, 1976, 41 y.o.   MRN: [id number]  HPI 41 y.o. with history of smoking, Q 2-3 years ago, WM presents with cough x 1 week, not improving. No fever, chills but has been having sweating. Cough with green mucus, sinus congestion, sore throat, no SOB, CP.  OTC ibuprofen, tylenol, robitussin.    Blood pressure 130/80, pulse 63, temperature 97.6 F (36.4 C), resp. rate 20, height 6\' 5"  (1.956 m), weight 286 lb 6.4 oz (129.9 kg), SpO2 97 %.  Medications Current Outpatient Medications on File Prior to Visit  Medication Sig  . bisoprolol-hydrochlorothiazide (ZIAC) 10-6.25 MG tablet Take 1 tablet daily for BP  . lisinopril (PRINIVIL,ZESTRIL) 40 MG tablet Take 1/2 to 1 tablet daily for BP   No current facility-administered medications on file prior to visit.     Problem list He has ADD (attention deficit disorder); Anxiety; Testosterone Deficiency; Vitamin D deficiency; Thyroiditis; Essential hypertension; Hyperlipidemia; Prediabetes; and Medication management on their problem list.   Review of Systems  Constitutional: Positive for diaphoresis. Negative for appetite change, chills and fever.  HENT: Positive for congestion, postnasal drip, rhinorrhea, sinus pressure and sore throat. Negative for ear pain, sneezing, trouble swallowing and voice change.   Eyes: Negative.   Respiratory: Positive for cough and wheezing. Negative for chest tightness and shortness of breath.   Cardiovascular: Negative.   Gastrointestinal: Negative.   Genitourinary: Negative.   Musculoskeletal: Negative.  Negative for neck pain.  Neurological: Negative.  Negative for headaches.       Objective:   Physical Exam  Constitutional: He is oriented to person, place, and time. He appears well-developed and well-nourished.  HENT:  Head: Normocephalic and atraumatic.  Right Ear: External ear normal.  Left Ear: External ear normal.  Nose: Nose normal.   Mouth/Throat: Oropharynx is clear and moist.  Eyes: Conjunctivae are normal. Pupils are equal, round, and reactive to light.  Neck: Normal range of motion. Neck supple.  Cardiovascular: Normal rate, regular rhythm and normal heart sounds.  No murmur heard. Pulmonary/Chest: Effort normal. No respiratory distress. He has wheezes. He has no rales. He exhibits no tenderness.  Abdominal: Soft. Bowel sounds are normal.  Lymphadenopathy:    He has no cervical adenopathy.  Neurological: He is alert and oriented to person, place, and time.  Skin: Skin is warm and dry.       Assessment & Plan:   Cough -     azithromycin (ZITHROMAX) 250 MG tablet; Take 2 tablets (500 mg) on  Day 1,  followed by 1 tablet (250 mg) once daily on Days 2 through 5. -     predniSONE (DELTASONE) 20 MG tablet; 2 tablets daily for 3 days, 1 tablet daily for 4 days. -     benzonatate (TESSALON PERLES) 100 MG capsule; Take 1 capsule (100 mg total) by mouth 3 (three) times daily as needed for cough (Max: 600mg  per day).  The patient was advised to call immediately if he has any concerning symptoms in the interval. The patient voices understanding of current treatment options and is in agreement with the current care plan.The patient knows to call the clinic with any problems, questions or concerns or go to the ER if any further progression of symptoms.     Future Appointments  Date Time Provider Department Center  06/24/2017  9:00 AM Quentin Mullingollier, Paislee Szatkowski, PA-C [facility] None  01/21/2018  9:00 AM Lucky CowboyMcKeown, William, MD [facility] None

## 2017-06-24 NOTE — Patient Instructions (Signed)

## 2017-07-20 ENCOUNTER — Other Ambulatory Visit: Payer: Self-pay | Admitting: Internal Medicine

## 2017-11-12 ENCOUNTER — Ambulatory Visit: Payer: BLUE CROSS/BLUE SHIELD | Admitting: Physician Assistant

## 2017-11-12 ENCOUNTER — Encounter: Payer: Self-pay | Admitting: Physician Assistant

## 2017-11-12 VITALS — BP 148/100 | HR 60 | Temp 97.7°F | Resp 18 | Ht 77.0 in | Wt 295.6 lb

## 2017-11-12 DIAGNOSIS — M79642 Pain in left hand: Secondary | ICD-10-CM | POA: Diagnosis not present

## 2017-11-12 DIAGNOSIS — M79641 Pain in right hand: Secondary | ICD-10-CM

## 2017-11-12 DIAGNOSIS — F419 Anxiety disorder, unspecified: Secondary | ICD-10-CM

## 2017-11-12 DIAGNOSIS — I10 Essential (primary) hypertension: Secondary | ICD-10-CM

## 2017-11-12 MED ORDER — DIAZEPAM 2 MG PO TABS
ORAL_TABLET | ORAL | 0 refills | Status: DC
Start: 2017-11-12 — End: 2018-05-01

## 2017-11-12 MED ORDER — TRAZODONE HCL 50 MG PO TABS: ORAL_TABLET | ORAL | 2 refills | Status: DC

## 2017-11-12 MED ORDER — MELOXICAM 15 MG PO TABS: ORAL_TABLET | ORAL | 1 refills | Status: DC

## 2017-11-12 NOTE — Patient Instructions (Addendum)
Mobic is an antiinflammatory It helps pain, can not take with aleve, or ibuprofen You can take tylenol (500mg ) or tylenol arthritis (650mg ) with the meloxicam/antiinflammatories. The max you can take of tylenol a day is 3000mg  daily, this is a max of 6 pills a day of the regular tyelnol (500mg ) or a max of 4 a day of the tylenol arthritis (650mg ) as long as no other medications you are taking contain tylenol.   Mobic can cause inflammation in your stomach and can cause ulcers or bleeding, this will look like black tarry stools Make sure you take your mobic with food Try not to take it daily, take AS needed Can take with zantac   Increase your lisinopril to a whole pill and monitor BP at home  Monitor your blood pressure at home, please keep a record and bring that in with you to your next office visit.   Go to the ER if any CP, SOB, nausea, dizziness, severe HA, changes vision/speech  Due to a recent study, SPRINT, we have changed our goal for the systolic or top blood pressure number. Ideally we want your top number at 120.  In the [facility] Trial, 5000 people were randomized to a goal BP of 120 and 5000 people were randomized to a goal BP of less than 140. The patients with the goal BP at 120 had LESS DEMENTIA, LESS HEART ATTACKS, AND LESS STROKES, AS WELL AS OVERALL DECREASED MORTALITY OR DEATH RATE.   If you are willing, our goal BP is the top number of 120.  Your most recent BP: BP: (!) 148/100(RIGHT ARM)   Take your medications faithfully as instructed. Maintain a healthy weight. Get at least 150 minutes of aerobic exercise per week. Minimize salt intake. Minimize alcohol intake  DASH Eating Plan DASH stands for "Dietary Approaches to Stop Hypertension." The DASH eating plan is a healthy eating plan that has been shown to reduce high blood pressure (hypertension). Additional health benefits may include reducing the risk of type 2 diabetes mellitus, heart disease, and stroke. The DASH  eating plan may also help with weight loss. WHAT DO I NEED TO KNOW ABOUT THE DASH EATING PLAN? For the DASH eating plan, you will follow these general guidelines:  Choose foods with a percent daily value for sodium of less than 5% (as listed on the food label).  Use salt-free seasonings or herbs instead of table salt or sea salt.  Check with your health care provider or pharmacist before using salt substitutes.  Eat lower-sodium products, often labeled as "lower sodium" or "no salt added."  Eat fresh foods.  Eat more vegetables, fruits, and low-fat dairy products.  Choose whole grains. Look for the word "whole" as the first word in the ingredient list.  Choose fish and skinless chicken or [place] more often than red meat. Limit fish, poultry, and meat to 6 oz (170 g) each day.  Limit sweets, desserts, sugars, and sugary drinks.  Choose heart-healthy fats.  Limit cheese to 1 oz (28 g) per day.  Eat more home-cooked food and less restaurant, buffet, and fast food.  Limit fried foods.  Cook foods using methods other than frying.  Limit canned vegetables. If you do use them, rinse them well to decrease the sodium.  When eating at a restaurant, ask that your food be prepared with less salt, or no salt if possible. WHAT FOODS CAN I EAT? Seek help from a dietitian for individual calorie needs. Grains Whole grain or whole  wheat bread. Brown rice. Whole grain or whole wheat pasta. Quinoa, bulgur, and whole grain cereals. Low-sodium cereals. Corn or whole wheat flour tortillas. Whole grain cornbread. Whole grain crackers. Low-sodium crackers. Vegetables Fresh or frozen vegetables (raw, steamed, roasted, or grilled). Low-sodium or reduced-sodium tomato and vegetable juices. Low-sodium or reduced-sodium tomato sauce and paste. Low-sodium or reduced-sodium canned vegetables.  Fruits All fresh, canned (in natural juice), or frozen fruits. Meat and Other Protein Products Ground beef (85%  or leaner), grass-fed beef, or beef trimmed of fat. Skinless chicken or [place]. Ground chicken or [place]. Pork trimmed of fat. All fish and seafood. Eggs. Dried beans, peas, or lentils. Unsalted nuts and seeds. Unsalted canned beans. Dairy Low-fat dairy products, such as skim or 1% milk, 2% or reduced-fat cheeses, low-fat ricotta or cottage cheese, or plain low-fat yogurt. Low-sodium or reduced-sodium cheeses. Fats and Oils Tub margarines without trans fats. Light or reduced-fat mayonnaise and salad dressings (reduced sodium). Avocado. Safflower, olive, or canola oils. Natural peanut or almond butter. Other Unsalted popcorn and pretzels. The items listed above may not be a complete list of recommended foods or beverages. Contact your dietitian for more options. WHAT FOODS ARE NOT RECOMMENDED? Grains White bread. White pasta. White rice. Refined cornbread. Bagels and croissants. Crackers that contain trans fat. Vegetables Creamed or fried vegetables. Vegetables in a cheese sauce. Regular canned vegetables. Regular canned tomato sauce and paste. Regular tomato and vegetable juices. Fruits Dried fruits. Canned fruit in light or heavy syrup. Fruit juice. Meat and Other Protein Products Fatty cuts of meat. Ribs, chicken wings, bacon, sausage, bologna, salami, chitterlings, fatback, hot dogs, bratwurst, and packaged luncheon meats. Salted nuts and seeds. Canned beans with salt. Dairy Whole or 2% milk, cream, half-and-half, and cream cheese. Whole-fat or sweetened yogurt. Full-fat cheeses or blue cheese. Nondairy creamers and whipped toppings. Processed cheese, cheese spreads, or cheese curds. Condiments Onion and garlic salt, seasoned salt, table salt, and sea salt. Canned and packaged gravies. Worcestershire sauce. Tartar sauce. Barbecue sauce. Teriyaki sauce. Soy sauce, including reduced sodium. Steak sauce. Fish sauce. Oyster sauce. Cocktail sauce. Horseradish. Ketchup and mustard. Meat flavorings  and tenderizers. Bouillon cubes. Hot sauce. Tabasco sauce. Marinades. Taco seasonings. Relishes. Fats and Oils Butter, stick margarine, lard, shortening, ghee, and bacon fat. Coconut, palm kernel, or palm oils. Regular salad dressings. Other Pickles and olives. Salted popcorn and pretzels. The items listed above may not be a complete list of foods and beverages to avoid. Contact your dietitian for more information. WHERE CAN I FIND MORE INFORMATION? National Heart, Lung, and Blood Institute: [employer] Document Released: 07/05/2011 Document Revised: 11/30/2013 Document Reviewed: 05/20/2013 [facility] Patient Information 2015 [place], [place]. This information is not intended to replace advice given to you by your health care provider. Make sure you discuss any questions you have with your health care provider.   11 Tips to Follow:  1. No caffeine after 3pm: Avoid beverages with caffeine (soda, tea, energy drinks, etc.) especially after 3pm. 2. Don't go to bed hungry: Have your evening meal at least 3 hrs. before going to sleep. It's fine to have a small bedtime snack such as a glass of milk and a few crackers but don't have a big meal. 3. Have a nightly routine before bed: Plan on "winding down" before you go to sleep. Begin relaxing about 1 hour before you go to bed. Try doing a quiet activity such as listening to calming music, reading a book or meditating. 4. Turn off the TV and  ALL electronics including video games, tablets, laptops, etc. 1 hour before sleep, and keep them out of the bedroom. 5. Turn off your cell phone and all notifications (new email and text alerts) or even better, leave your phone outside your room while you sleep. Studies have shown that a part of your brain continues to respond to certain lights and sounds even while you're still asleep. 6. Make your bedroom quiet, dark and cool. If you can't control the noise, try wearing earplugs or  using a fan to block out other sounds. 7. Practice relaxation techniques. Try reading a book or meditating or drain your brain by writing a list of what you need to do the next day. 8. Don't nap unless you feel sick: you'll have a better night's sleep. 9. Don't smoke, or quit if you do. Nicotine, alcohol, and marijuana can all keep you awake. Talk to your health care provider if you need help with substance use. 10. Most importantly, wake up at the same time every day (or within 1 hour of your usual wake up time) EVEN on the weekends. A regular wake up time promotes sleep hygiene and prevents sleep problems. 11. Reduce exposure to bright light in the last three hours of the day before going to sleep. Maintaining good sleep hygiene and having good sleep habits lower your risk of developing sleep problems. Getting better sleep can also improve your concentration and alertness. Try the simple steps in this guide. If you still have trouble getting enough rest, make an appointment with your health care provider.

## 2017-11-12 NOTE — Progress Notes (Signed)
Subjective:    Patient ID: Cameron Donovan, male    DOB: [birth year], 42 y.o.   MRN: [id number]  HPI 42 y.o. right handed WM works as Psychologist, occupationalwelder presents with right hands pain x 2 weeks.   He does not recall an injury to his right hand. States feels it is in 2nd and 3rd knuckles, has swelling. Has pain in his middle finger, feels like he "jammed it" but denies injury. Stiffness will last 1-2 hours in the AM. Has taken ibuprofen a few times that help some. No catching with that finger. 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 Mom had arthritis, uncertain if RA or not.   He is not checking his BP at home, he is only on 1/2 of lisinopril at home and 1 ziac.   He also states he feels he may need to get back on a nerve medication, he is under a lot of stress, working 6 days a week and has every day anxiety. Has trouble sleeping, eating too little, no diarrhea/constipation, nausea, headaches. He was on valium in the past. Does not want a daily medication.   Blood pressure (!) 148/100, pulse 60, temperature 97.7 F (36.5 C), resp. rate 18, height 6\' 5"  (1.956 m), weight 295 lb 9.6 oz (134.1 kg), SpO2 98 %.  Medications Current Outpatient Medications on File Prior to Visit  Medication Sig  . bisoprolol-hydrochlorothiazide (ZIAC) 10-6.25 MG tablet TAKE 1 TABLET BY MOUTH DAILY FOR BLOOD PRESSURE  . lisinopril (PRINIVIL,ZESTRIL) 40 MG tablet Take 1/2 to 1 tablet daily for BP   No current facility-administered medications on file prior to visit.     Problem list He has ADD (attention deficit disorder); Anxiety; Testosterone Deficiency; Vitamin D deficiency; Thyroiditis; Essential hypertension; Hyperlipidemia; Prediabetes; and Medication management on their problem list.   Review of Systems See HPI    Objective:   Physical Exam  Constitutional: He is oriented to person, place, and time. He appears well-developed and well-nourished.  HENT:  Head:  Normocephalic and atraumatic.  Right Ear: External ear normal.  Left Ear: External ear normal.  Nose: Nose normal.  Mouth/Throat: Oropharynx is clear and moist.  Eyes: Pupils are equal, round, and reactive to light. Conjunctivae are normal.  Neck: Normal range of motion. Neck supple.  Cardiovascular: Normal rate, regular rhythm and normal heart sounds.  No murmur heard. Pulmonary/Chest: Effort normal. No respiratory distress. He has no wheezes. He has no rales. He exhibits no tenderness.  Abdominal: Soft. Bowel sounds are normal.  Musculoskeletal: He exhibits tenderness.  Bilateral MCPs swollen with slight erythema, right 3rd and 4th MCP worse, decreased grip due to swelling. Nontender, good strength, no warmth. Good distal neurovascular.   Lymphadenopathy:    He has no cervical adenopathy.  Neurological: He is alert and oriented to person, place, and time.  Skin: Skin is warm and dry. No rash noted.       Assessment & Plan:    Essential hypertension Increase lisinopril to 40mg  or 1 pill daily, monitor bp at home Go to the ER if any chest pain, shortness of breath, nausea, dizziness, severe HA, changes vision/speech -     CBC with Differential/Platelet -     BASIC METABOLIC PANEL WITH GFR -     Hepatic function panel  Anxiety Counseled that valium is SHORT term Will take as needed Declines daily pill at this time, suggest readdressing at CPE  -  diazepam (VALIUM) 2 MG tablet; 1/2-1 tablet to take once daily AS needed for anxiety -     traZODone (DESYREL) 50 MG tablet; 1/2-1 tablet for sleep  Bilateral hand pain 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  -     Sedimentation rate -     Rheumatoid factor -     ANA -     Cyclic citrul peptide antibody, IgG -     B. burgdorfi antibodies -     Uric acid -     meloxicam (MOBIC) 15 MG tablet;  Take one daily with food for 2 weeks, can take with tylenol, can not take with aleve, iburpofen, then as needed daily for pain     Future Appointments  Date Time Provider Department Center  01/21/2018  9:00 AM [name], MD [facility] None

## 2017-11-14 LAB — BASIC METABOLIC PANEL WITH GFR
BUN: 8 mg/dL (ref 7–25)
CO2: 28 mmol/L (ref 20–32)
CREATININE: 0.99 mg/dL (ref 0.60–1.35)
Calcium: 9.2 mg/dL (ref 8.6–10.3)
Chloride: 104 mmol/L (ref 98–110)
GFR, EST AFRICAN AMERICAN: 108 mL/min/{1.73_m2} (ref >=60)
GFR, EST NON AFRICAN AMERICAN: 93 mL/min/{1.73_m2} (ref >=60)
Glucose, Bld: 113 mg/dL — ABNORMAL HIGH (ref 65–99)
POTASSIUM: 5.1 mmol/L (ref 3.5–5.3)
SODIUM: 137 mmol/L (ref 135–146)

## 2017-11-14 LAB — CBC WITH DIFFERENTIAL/PLATELET
Basophils Absolute: 100 cells/uL (ref 0–200)
Basophils Relative: 1.2 %
EOS ABS: 257 {cells}/uL (ref 15–500)
Eosinophils Relative: 3.1 %
HEMATOCRIT: 42.4 % (ref 38.5–50.0)
Hemoglobin: 14.6 g/dL (ref 13.2–17.1)
LYMPHS ABS: 1569 {cells}/uL (ref 850–3900)
MCH: 32.4 pg (ref 27.0–33.0)
MCHC: 34.4 g/dL (ref 32.0–36.0)
MCV: 94.2 fL (ref 80.0–100.0)
MPV: 10 fL (ref 7.5–12.5)
Monocytes Relative: 8.5 %
NEUTROS PCT: 68.3 %
Neutro Abs: 5669 cells/uL (ref 1500–7800)
Platelets: 323 10*3/uL (ref 140–400)
RBC: 4.5 10*6/uL (ref 4.20–5.80)
RDW: 11.9 % (ref 11.0–15.0)
Total Lymphocyte: 18.9 %
WBC mixed population: 706 cells/uL (ref 200–950)
WBC: 8.3 10*3/uL (ref 3.8–10.8)

## 2017-11-14 LAB — HEPATIC FUNCTION PANEL
AG Ratio: 1.8 (calc) (ref 1.0–2.5)
ALBUMIN MSPROF: 4.4 g/dL (ref 3.6–5.1)
ALKALINE PHOSPHATASE (APISO): 63 U/L (ref 40–115)
ALT: 40 U/L (ref 9–46)
AST: 30 U/L (ref 10–40)
BILIRUBIN DIRECT: 0.1 mg/dL (ref 0.0–0.2)
BILIRUBIN TOTAL: 0.3 mg/dL (ref 0.2–1.2)
Globulin: 2.5 g/dL (calc) (ref 1.9–3.7)
Indirect Bilirubin: 0.2 mg/dL (calc) (ref 0.2–1.2)
Total Protein: 6.9 g/dL (ref 6.1–8.1)

## 2017-11-14 LAB — URIC ACID: Uric Acid, Serum: 6.5 mg/dL (ref 4.0–8.0)

## 2017-11-14 LAB — ANA: Anti Nuclear Antibody(ANA): NEGATIVE

## 2017-11-14 LAB — SEDIMENTATION RATE: Sed Rate: 2 mm/h (ref 0–15)

## 2017-11-14 LAB — RHEUMATOID FACTOR: Rhuematoid fact SerPl-aCnc: <14 IU/mL (ref <14)

## 2017-11-14 LAB — B. BURGDORFI ANTIBODIES

## 2017-11-14 LAB — CYCLIC CITRUL PEPTIDE ANTIBODY, IGG: Cyclic Citrullin Peptide Ab: <16 UNITS

## 2018-01-20 ENCOUNTER — Encounter: Payer: Self-pay | Admitting: Internal Medicine

## 2018-01-20 NOTE — Patient Instructions (Signed)

## 2018-01-20 NOTE — Progress Notes (Signed)
[facility]   Cameron Donovan, M.D.     Cameron Donovan. Cameron Donovan, P.A.-C Cameron Gaudier, DNP Cameron Donovan                [address]                [place], [place]. [address] Telephone [phone] Telefax [phone] Annual  Screening/Preventative Visit  & Comprehensive Evaluation & Examination     This very nice 42 y.o. Cameron Donovan presents for a Screening /Preventative Visit & comprehensive evaluation and management of multiple medical co-morbidities.  Patient has been followed for HTN, HLD, Prediabetes and Vitamin D Deficiency.     HTN predates circa 2007. Patient's BP has been controlled at home.  Today's BP is elevated at 174/110 and rechecked at 180/110. Patient denies any cardiac symptoms as chest pain, palpitations, shortness of breath, dizziness or ankle swelling.     Patient's hyperlipidemia is not controlled with diet and medications. Patient denies myalgias or other medication SE's. Last lipids were not at goal: Lab Results  Component Value Date   CHOL 253 (H) 01/21/2018   HDL 60 01/21/2018   LDLCALC 166 (H) 01/21/2018   TRIG 134 01/21/2018   CHOLHDL 4.2 01/21/2018      Patient has prediabetes (A1c 5.7% in 2012) and patient denies reactive hypoglycemic symptoms, visual blurring, diabetic polys or paresthesias. Last A1c was Normal & at goal : Lab Results  Component Value Date   HGBA1C 5.3 01/10/2017       Finally, patient has history of Vitamin D Deficiency ("39" in 2012). Patient is not compliant & does not supplement as previously recommended.  Last vitamin D was still very low:  Lab Results  Component Value Date   VD25OH 25 (L) 01/10/2017   Current Outpatient Medications on File Prior to Visit  Medication Sig  . diazepam (VALIUM) 2 MG tablet 1/2-1 tablet to take once daily AS needed for anxiety  . meloxicam (MOBIC) 15 MG tablet Take one daily with food for 2 weeks, can take with tylenol, can not take with  aleve, iburpofen, then as needed daily for pain  . traZODone (DESYREL) 50 MG tablet 1/2-1 tablet for sleep   No current facility-administered medications on file prior to visit.     No Know Allergies  Past Medical History:  Diagnosis Date  . ADD (attention deficit disorder)   . Anxiety   . Hypertension   . Hypogonadism male   . Thyroiditis   . Vitamin D deficiency    Health Maintenance  Topic Date Due  . HIV Screening  03/17/1989  . INFLUENZA VACCINE  02/27/2018  . TETANUS/TDAP  09/23/2024   Immunization History  Administered Date(s) Administered  . Influenza Split 04/29/2015  . PPD Test 09/23/2014, 01/21/2018  . Tdap 09/23/2014    History reviewed. No pertinent surgical history.   History reviewed. No pertinent family history.   Social History   Socioeconomic History  . Marital status: Divorced     Spouse name: Has a Girlfriend  . Number of children: 1 daughter at [facility]  Occupational History  . Welder  Tobacco Use  . Smoking status: Never Smoker  . Smokeless tobacco: Current User    Types: Chew  Substance and Sexual Activity  . Alcohol use: Yes    Alcohol/week: 3.6 oz    Types: 6 Cans of beer per week  . Drug use: Yes    Frequency: 1.0 times per week  Types: Marijuana  . Sexual activity: Yes    Partners: Female  Lifestyle  . Physical activity:    Days per week: Not on file    Minutes per session: Not on file  . Stress: Not on file    ROS Constitutional: Denies fever, chills, weight loss/gain, headaches, insomnia,  night sweats or change in appetite. Does c/o fatigue. Eyes: Denies redness, blurred vision, diplopia, discharge, itchy or watery eyes.  ENT: Denies discharge, congestion, post nasal drip, epistaxis, sore throat, earache, hearing loss, dental pain, Tinnitus, Vertigo, Sinus pain or snoring.  Cardio: Denies chest pain, palpitations, irregular heartbeat, syncope, dyspnea, diaphoresis, orthopnea, PND, claudication or edema Respiratory: denies  cough, dyspnea, DOE, pleurisy, hoarseness, laryngitis or wheezing.  Gastrointestinal: Denies dysphagia, heartburn, reflux, water brash, pain, cramps, nausea, vomiting, bloating, diarrhea, constipation, hematemesis, melena, hematochezia, jaundice or hemorrhoids Genitourinary: Denies dysuria, frequency, urgency, nocturia, hesitancy, discharge, hematuria or flank pain Musculoskeletal: Denies arthralgia, myalgia, stiffness, Jt. Swelling, pain, limp or strain/sprain. Denies Falls. Skin: Denies puritis, rash, hives, warts, acne, eczema or change in skin lesion Neuro: No weakness, tremor, incoordination, spasms, paresthesia or pain Psychiatric: Denies confusion, memory loss or sensory loss. Denies Depression. Endocrine: Denies change in weight, skin, hair change, nocturia, and paresthesia, diabetic polys, visual blurring or hyper / hypo glycemic episodes.  Heme/Lymph: No excessive bleeding, bruising or enlarged lymph nodes.  Physical Exam  BP (!) 174/110   Pulse 60   Temp 97.7 F (36.5 C)   Resp 18   Ht 6\' 5"  (1.956 m)   Wt 288 lb 9.6 oz (130.9 kg)   BMI 34.22 kg/m   General Appearance: Well nourished and well groomed and in no apparent distress.  Eyes: PERRLA, EOMs, conjunctiva no swelling or erythema, normal fundi and vessels. Sinuses: No frontal/maxillary tenderness ENT/Mouth: EACs patent / TMs  nl. Nares clear without erythema, swelling, mucoid exudates. Oral hygiene is good. No erythema, swelling, or exudate. Tongue normal, non-obstructing. Tonsils not swollen or erythematous. Hearing normal.  Neck: Supple, thyroid not palpable. No bruits, nodes or JVD. Respiratory: Respiratory effort normal.  BS equal and clear bilateral without rales, rhonci, wheezing or stridor. Cardio: Heart sounds are normal with regular rate and rhythm and no murmurs, rubs or gallops. Peripheral pulses are normal and equal bilaterally without edema. No aortic or femoral bruits. Chest: symmetric with normal  excursions and percussion.  Abdomen: Soft, with Nl bowel sounds. Nontender, no guarding, rebound, hernias, masses, or organomegaly.  Lymphatics: Non tender without lymphadenopathy.  Genitourinary: No hernias.Testes nl. DRE - prostate nl for age - smooth & firm w/o nodules. Musculoskeletal: Full ROM all peripheral extremities, joint stability, 5/5 strength, and normal gait. Skin: Warm and dry without rashes, lesions, cyanosis, clubbing or  ecchymosis.  Neuro: Cranial nerves intact, reflexes equal bilaterally. Normal muscle tone, no cerebellar symptoms. Sensation intact.  Pysch: Alert and oriented X 3 with normal affect, insight and judgment appropriate.   Assessment and Plan  1. Annual Preventative/Screening Exam   2. Essential hypertension  - EKG 12-Lead - Urinalysis, Routine w reflex microscopic - Microalbumin / creatinine urine ratio - CBC with Differential/Platelet - COMPLETE METABOLIC PANEL WITH GFR - Magnesium - TSH  - Add minoxidil  10 MG tablet; Take 1/2 to 1 tablet every morning for BP  Disp: 90 tablet; Refill: 1 - Increase ZIAC 10-6.25 MG tablet; Take 1 tablet every morning for BP  Dispense: 90 tablet; Refill: 1 - lisinopril (PRINIVIL,ZESTRIL) 40 MG tablet; Take 1 tablet at night  for BP  Dispense: 90 tablet; Refill: 1  3. Hyperlipidemia, mixed  - EKG 12-Lead - Lipid panel - TSH  4. Prediabetes  - Hemoglobin A1c - Insulin, random  5. Vitamin D deficiency  - VITAMIN D 25 Hydroxyl  6. Screening for colorectal cancer  - POC Hemoccult Bld/Stl   7. Prostate cancer screening  - PSA  8. Screening for ischemic heart disease  - EKG 12-Lead  9. Fatigue, unspecified type  - Iron,Total/Total Iron Binding Cap - Vitamin B12 - Testosterone - CBC with Differential/Platelet  10. Medication management  - Urinalysis, Routine w reflex microscopic - Microalbumin / creatinine urine ratio - CBC with Differential/Platelet  11. Screening examination for pulmonary  tuberculosis  - PPD     Patient was counseled in prudent diet, weight control to achieve/maintain BMI less than 25, BP monitoring, regular exercise and medications as discussed.  Discussed med effects and SE's. Routine screening labs and tests as requested with regular follow-up as recommended. Over 40 minutes of exam, counseling, chart review and high complex critical decision making was performed

## 2018-01-21 ENCOUNTER — Ambulatory Visit: Payer: BLUE CROSS/BLUE SHIELD | Admitting: Internal Medicine

## 2018-01-21 VITALS — BP 174/110 | HR 60 | Temp 97.7°F | Resp 18 | Ht 77.0 in | Wt 288.6 lb

## 2018-01-21 DIAGNOSIS — E782 Mixed hyperlipidemia: Secondary | ICD-10-CM

## 2018-01-21 DIAGNOSIS — Z136 Encounter for screening for cardiovascular disorders: Secondary | ICD-10-CM

## 2018-01-21 DIAGNOSIS — Z79899 Other long term (current) drug therapy: Secondary | ICD-10-CM | POA: Diagnosis not present

## 2018-01-21 DIAGNOSIS — I10 Essential (primary) hypertension: Secondary | ICD-10-CM | POA: Diagnosis not present

## 2018-01-21 DIAGNOSIS — R7303 Prediabetes: Secondary | ICD-10-CM

## 2018-01-21 DIAGNOSIS — Z125 Encounter for screening for malignant neoplasm of prostate: Secondary | ICD-10-CM | POA: Diagnosis not present

## 2018-01-21 DIAGNOSIS — Z0001 Encounter for general adult medical examination with abnormal findings: Secondary | ICD-10-CM

## 2018-01-21 DIAGNOSIS — Z111 Encounter for screening for respiratory tuberculosis: Secondary | ICD-10-CM | POA: Diagnosis not present

## 2018-01-21 DIAGNOSIS — R5383 Other fatigue: Secondary | ICD-10-CM

## 2018-01-21 DIAGNOSIS — E559 Vitamin D deficiency, unspecified: Secondary | ICD-10-CM

## 2018-01-21 DIAGNOSIS — Z1211 Encounter for screening for malignant neoplasm of colon: Secondary | ICD-10-CM

## 2018-01-21 DIAGNOSIS — Z1212 Encounter for screening for malignant neoplasm of rectum: Secondary | ICD-10-CM

## 2018-01-21 MED ORDER — MINOXIDIL 10 MG PO TABS: ORAL_TABLET | ORAL | 1 refills | Status: DC

## 2018-01-21 MED ORDER — BISOPROLOL-HYDROCHLOROTHIAZIDE 10-6.25 MG PO TABS: ORAL_TABLET | ORAL | 1 refills | Status: DC

## 2018-01-21 MED ORDER — LISINOPRIL 40 MG PO TABS: ORAL_TABLET | ORAL | 1 refills | Status: DC

## 2018-01-22 ENCOUNTER — Other Ambulatory Visit: Payer: Self-pay | Admitting: Internal Medicine

## 2018-01-22 LAB — URINALYSIS, ROUTINE W REFLEX MICROSCOPIC
BACTERIA UA: NONE SEEN /HPF
Bilirubin Urine: NEGATIVE
Glucose, UA: NEGATIVE
HYALINE CAST: NONE SEEN /LPF
Hgb urine dipstick: NEGATIVE
KETONES UR: NEGATIVE
Nitrite: NEGATIVE
PH: 5.5 (ref 5.0–8.0)
Protein, ur: NEGATIVE
RBC / HPF: NONE SEEN /HPF (ref 0–2)
SPECIFIC GRAVITY, URINE: 1.013 (ref 1.001–1.03)
SQUAMOUS EPITHELIAL / LPF: NONE SEEN /HPF (ref <=5)

## 2018-01-22 LAB — CBC WITH DIFFERENTIAL/PLATELET
BASOS ABS: 118 {cells}/uL (ref 0–200)
Basophils Relative: 1.4 %
EOS ABS: 134 {cells}/uL (ref 15–500)
Eosinophils Relative: 1.6 %
HCT: 48.2 % (ref 38.5–50.0)
HEMOGLOBIN: 16.3 g/dL (ref 13.2–17.1)
Lymphs Abs: 1529 cells/uL (ref 850–3900)
MCH: 32.3 pg (ref 27.0–33.0)
MCHC: 33.8 g/dL (ref 32.0–36.0)
MCV: 95.4 fL (ref 80.0–100.0)
MPV: 10 fL (ref 7.5–12.5)
Monocytes Relative: 10.2 %
NEUTROS ABS: 5762 {cells}/uL (ref 1500–7800)
Neutrophils Relative %: 68.6 %
Platelets: 324 10*3/uL (ref 140–400)
RBC: 5.05 10*6/uL (ref 4.20–5.80)
RDW: 12.6 % (ref 11.0–15.0)
Total Lymphocyte: 18.2 %
WBC mixed population: 857 cells/uL (ref 200–950)
WBC: 8.4 10*3/uL (ref 3.8–10.8)

## 2018-01-22 LAB — COMPLETE METABOLIC PANEL WITH GFR
AG Ratio: 1.6 (calc) (ref 1.0–2.5)
ALBUMIN MSPROF: 5.1 g/dL (ref 3.6–5.1)
ALKALINE PHOSPHATASE (APISO): 60 U/L (ref 40–115)
ALT: 44 U/L (ref 9–46)
AST: 35 U/L (ref 10–40)
BUN: 12 mg/dL (ref 7–25)
CO2: 27 mmol/L (ref 20–32)
CREATININE: 1.11 mg/dL (ref 0.60–1.35)
Calcium: 10.3 mg/dL (ref 8.6–10.3)
Chloride: 98 mmol/L (ref 98–110)
GFR, EST AFRICAN AMERICAN: 94 mL/min/{1.73_m2} (ref >=60)
GFR, Est Non African American: 81 mL/min/{1.73_m2} (ref >=60)
GLOBULIN: 3.1 g/dL (ref 1.9–3.7)
Glucose, Bld: 117 mg/dL — ABNORMAL HIGH (ref 65–99)
Potassium: 5.4 mmol/L — ABNORMAL HIGH (ref 3.5–5.3)
SODIUM: 134 mmol/L — AB (ref 135–146)
TOTAL PROTEIN: 8.2 g/dL — AB (ref 6.1–8.1)
Total Bilirubin: 0.5 mg/dL (ref 0.2–1.2)

## 2018-01-22 LAB — TSH: TSH: 1.29 m[IU]/L (ref 0.40–4.50)

## 2018-01-22 LAB — MICROALBUMIN / CREATININE URINE RATIO
CREATININE, URINE: 96 mg/dL (ref 20–320)
MICROALB UR: 0.8 mg/dL
MICROALB/CREAT RATIO: 8 ug/mg{creat} (ref <30)

## 2018-01-22 LAB — LIPID PANEL
Cholesterol: 253 mg/dL — ABNORMAL HIGH (ref <200)
HDL: 60 mg/dL (ref >40)
LDL CHOLESTEROL (CALC): 166 mg/dL — AB
NON-HDL CHOLESTEROL (CALC): 193 mg/dL — AB (ref <130)
Total CHOL/HDL Ratio: 4.2 (calc) (ref <5.0)
Triglycerides: 134 mg/dL (ref <150)

## 2018-01-22 LAB — MAGNESIUM: MAGNESIUM: 1.9 mg/dL (ref 1.5–2.5)

## 2018-01-22 LAB — HEMOGLOBIN A1C
EAG (MMOL/L): 5.8 (calc)
HEMOGLOBIN A1C: 5.3 %{Hb} (ref <5.7)
MEAN PLASMA GLUCOSE: 105 (calc)

## 2018-01-22 LAB — PSA: PSA: 1.1 ng/mL (ref <=4.0)

## 2018-01-22 LAB — TESTOSTERONE: Testosterone: 476 ng/dL (ref 250–827)

## 2018-01-22 LAB — VITAMIN D 25 HYDROXY (VIT D DEFICIENCY, FRACTURES): Vit D, 25-Hydroxy: 20 ng/mL — ABNORMAL LOW (ref 30–100)

## 2018-01-22 LAB — IRON, TOTAL/TOTAL IRON BINDING CAP
%SAT: 25 % (calc) (ref 20–48)
Iron: 89 ug/dL (ref 50–180)
TIBC: 362 mcg/dL (calc) (ref 250–425)

## 2018-01-22 LAB — INSULIN, RANDOM: Insulin: 12.9 u[IU]/mL (ref 2.0–19.6)

## 2018-01-22 LAB — VITAMIN B12: Vitamin B-12: 362 pg/mL (ref 200–1100)

## 2018-01-22 MED ORDER — ROSUVASTATIN CALCIUM 40 MG PO TABS: ORAL_TABLET | ORAL | 1 refills | Status: DC

## 2018-01-23 LAB — TB SKIN TEST
Induration: 0 mm
TB SKIN TEST: NEGATIVE

## 2018-02-21 ENCOUNTER — Ambulatory Visit: Payer: Self-pay | Admitting: Internal Medicine

## 2018-04-23 DIAGNOSIS — E66811 Obesity, class 1: Secondary | ICD-10-CM | POA: Insufficient documentation

## 2018-04-23 DIAGNOSIS — E669 Obesity, unspecified: Secondary | ICD-10-CM | POA: Insufficient documentation

## 2018-04-23 NOTE — Progress Notes (Deleted)
FOLLOW UP  Assessment and Plan:   Hypertension Well controlled with current medications  Monitor blood pressure at home; patient to call if consistently greater than 130/80 Continue DASH diet.   Reminder to go to the ER if any CP, SOB, nausea, dizziness, severe HA, changes vision/speech, left arm numbness and tingling and jaw pain.  Cholesterol Currently above goal; newly on rosuvastatin *** Continue low cholesterol diet and exercise.  Check lipid panel.   Other abnormal glucose Recent A1Cs at goal Discussed diet/exercise, weight management  Defer A1C; check CMP  Obesity with co morbidities Long discussion about weight loss, diet, and exercise Recommended diet heavy in fruits and veggies and low in animal meats, cheeses, and dairy products, appropriate calorie intake Discussed ideal weight for height and initial weight goal (***) Patient will work on *** Will follow up in 3 months  Vitamin D Def Below goal at last visit;  He has *** initiated supplement Check Vit D level  Continue diet and meds as discussed. Further disposition pending results of labs. Discussed med's effects and SE's.   Over 30 minutes of exam, counseling, chart review, and critical decision making was performed.   Future Appointments  Date Time Provider Department Center  04/24/2018  8:45 AM [name], NP [facility] None  08/08/2018  9:30 AM [name], MD [facility] None  02/17/2019  9:00 AM [name], MD [facility] None    ----------------------------------------------------------------------------------------------------------------------  HPI 42 y.o. male  presents for 3 month follow up on hypertension, cholesterol, glucose management, obesity, anxiety and vitamin D deficiency.   he has a diagnosis of anxiety and is currently on valium 1-2 mg once daily PRN severe anxiety, reports symptoms are*** well controlled on current regimen. he *** He is also prescribed low dose trazodone  for sleep.   BMI is There is no height or weight on file to calculate BMI., he {HAS HAS ZOX:09604} been working on diet and exercise. Wt Readings from Last 3 Encounters:  01/21/18 288 lb 9.6 oz (130.9 kg)  11/12/17 295 lb 9.6 oz (134.1 kg)  06/24/17 286 lb 6.4 oz (129.9 kg)   His blood pressure {HAS HAS NOT:18834} been controlled at home, today their BP is    He {DOES_DOES VWU:98119} workout. He denies chest pain, shortness of breath, dizziness.   He is on cholesterol medication Rosuvastatin *** and denies myalgias. His cholesterol is not at goal. The cholesterol last visit was:   Lab Results  Component Value Date   CHOL 253 (H) 01/21/2018   HDL 60 01/21/2018   LDLCALC 166 (H) 01/21/2018   TRIG 134 01/21/2018   CHOLHDL 4.2 01/21/2018    He {Has/has not:18111} been working on diet and exercise for glucose management, and denies {Symptoms; diabetes w/o none:19199}. Last A1C in the office was:  Lab Results  Component Value Date   HGBA1C 5.3 01/21/2018   He has hx of thyroiditis and TSHs are monitored closely:    Lab Results  Component Value Date   TSH 1.29 01/21/2018   Patient is on Vitamin D supplement.   Lab Results  Component Value Date   VD25OH 20 (L) 01/21/2018        Current Medications:  Current Outpatient Medications on File Prior to Visit  Medication Sig  . bisoprolol-hydrochlorothiazide (ZIAC) 10-6.25 MG tablet Take 1 tablet every morning for BP  . diazepam (VALIUM) 2 MG tablet 1/2-1 tablet to take once daily AS needed for anxiety  . lisinopril (PRINIVIL,ZESTRIL) 40 MG tablet Take 1 tablet  at night  for BP  . meloxicam (MOBIC) 15 MG tablet Take one daily with food for 2 weeks, can take with tylenol, can not take with aleve, iburpofen, then as needed daily for pain  . minoxidil (LONITEN) 10 MG tablet Take 1/2 to 1 tablet every morning for BP  . rosuvastatin (CRESTOR) 40 MG tablet Take 1/2 to 1 tablet daily or as directed for Cholesterol  . traZODone (DESYREL) 50  MG tablet 1/2-1 tablet for sleep   No current facility-administered medications on file prior to visit.      Allergies: No Known Allergies   Medical History:  Past Medical History:  Diagnosis Date  . ADD (attention deficit disorder)   . Anxiety   . Hypertension   . Hypogonadism male   . Thyroiditis   . Vitamin D deficiency    Family history- Reviewed and unchanged Social history- Reviewed and unchanged   Review of Systems:  Review of Systems  Constitutional: Negative for malaise/fatigue and weight loss.  HENT: Negative for hearing loss and tinnitus.   Eyes: Negative for blurred vision and double vision.  Respiratory: Negative for cough, shortness of breath and wheezing.   Cardiovascular: Negative for chest pain, palpitations, orthopnea, claudication and leg swelling.  Gastrointestinal: Negative for abdominal pain, blood in stool, constipation, diarrhea, heartburn, melena, nausea and vomiting.  Genitourinary: Negative.   Musculoskeletal: Negative for joint pain and myalgias.  Skin: Negative for rash.  Neurological: Negative for dizziness, tingling, sensory change, weakness and headaches.  Endo/Heme/Allergies: Negative for polydipsia.  Psychiatric/Behavioral: Negative.   All other systems reviewed and are negative.     Physical Exam: There were no vitals taken for this visit. Wt Readings from Last 3 Encounters:  01/21/18 288 lb 9.6 oz (130.9 kg)  11/12/17 295 lb 9.6 oz (134.1 kg)  06/24/17 286 lb 6.4 oz (129.9 kg)   General Appearance: Well nourished, in no apparent distress. Eyes: PERRLA, EOMs, conjunctiva no swelling or erythema Sinuses: No Frontal/maxillary tenderness ENT/Mouth: Ext aud canals clear, TMs without erythema, bulging. No erythema, swelling, or exudate on post pharynx.  Tonsils not swollen or erythematous. Hearing normal.  Neck: Supple, thyroid normal.  Respiratory: Respiratory effort normal, BS equal bilaterally without rales, rhonchi, wheezing or  stridor.  Cardio: RRR with no MRGs. Brisk peripheral pulses without edema.  Abdomen: Soft, + BS.  Non tender, no guarding, rebound, hernias, masses. Lymphatics: Non tender without lymphadenopathy.  Musculoskeletal: Full ROM, 5/5 strength, {PSY - GAIT AND STATION:22860} gait Skin: Warm, dry without rashes, lesions, ecchymosis.  Neuro: Cranial nerves intact. No cerebellar symptoms.  Psych: Awake and oriented X 3, normal affect, Insight and Judgment appropriate.    [name], NP 8:06 AM [facility]

## 2018-04-24 ENCOUNTER — Ambulatory Visit: Payer: Self-pay | Admitting: Adult Health

## 2018-04-30 NOTE — Progress Notes (Signed)
FOLLOW UP  Assessment and Plan:   Hypertension Somewhat elevated with current agents, on max doses respectably, but working aggressively on lifestyle - will start monitoring at home, prefers to defer adding an agent today Monitor blood pressure at home; patient to call if consistently greater than 130/80 Continue DASH diet.   Reminder to go to the ER if any CP, SOB, nausea, dizziness, severe HA, changes vision/speech, left arm numbness and tingling and jaw pain.  Cholesterol Currently above goal; newly on rosuvastatin, taking 40 mg 3-4 days a week Continue low cholesterol diet and exercise.  Check lipid panel.   Other abnormal glucose Recent A1Cs at goal Discussed diet/exercise, weight management  Defer A1C; check CMP  Obesity with co morbidities Successfully losing 6-7 lb between each visit Long discussion about weight loss, diet, and exercise Recommended diet heavy in fruits and veggies and low in animal meats, cheeses, and dairy products, appropriate calorie intake Discussed ideal weight for height  Patient will work on continuing current lifestyle changes Will follow up in 3 months  Vitamin D Def Below goal at last visit;  He has initiated supplement - 10000 IU daily, though admits to taking irregularly Check Vit D level  Anxiety Well controlled, hasn't used valium in several months, discussed and agreeable to d/c today Stress management techniques discussed, increase water, good sleep hygiene discussed, increase exercise, and increase veggies.    Continue diet and meds as discussed. Further disposition pending results of labs. Discussed med's effects and SE's.   Over 30 minutes of exam, counseling, chart review, and critical decision making was performed.   Future Appointments  Date Time Provider Department Center  08/08/2018  9:30 AM [name], MD [facility] None  02/17/2019  9:00 AM [name], MD [facility] None     ----------------------------------------------------------------------------------------------------------------------  HPI 42 y.o. male  presents for 3 month follow up on hypertension, cholesterol, glucose management, obesity, anxiety and vitamin D deficiency.   he has a diagnosis of anxiety and is currently on valium 1-2 mg once daily PRN severe anxiety, reports symptoms are well controlled on current regimen. he currently takes very rarely, can't recall last time he took.  He is also prescribed low dose trazodone for sleep.   BMI is Body mass index is 33.44 kg/m., he has been working on diet and exercise. He has been pushing water, 5-6 bottles daily. He is active at work, he eats 1 meal per day, cut out soft drinks, only occasionally drinks sweet tea.  Wt Readings from Last 3 Encounters:  05/01/18 282 lb (127.9 kg)  01/21/18 288 lb 9.6 oz (130.9 kg)  11/12/17 295 lb 9.6 oz (134.1 kg)   He is not currently checking BPs at home, today their BP is BP: 138/88 after manual recheck by provider.   He does not workout but works a physically intense job. He denies chest pain, shortness of breath, dizziness.   He is on cholesterol medication Rosuvastatin 40 mg every other day and denies myalgias. His cholesterol is not at goal. The cholesterol last visit was:   Lab Results  Component Value Date   CHOL 253 (H) 01/21/2018   HDL 60 01/21/2018   LDLCALC 166 (H) 01/21/2018   TRIG 134 01/21/2018   CHOLHDL 4.2 01/21/2018    He has been working on diet and exercise for glucose management, and denies foot ulcerations, increased appetite, nausea, paresthesia of the feet, polydipsia, polyuria and visual disturbances. Last A1C in the office was:  Lab Results  Component  Value Date   HGBA1C 5.3 01/21/2018   He has hx of thyroiditis and TSHs are monitored closely:    Lab Results  Component Value Date   TSH 1.29 01/21/2018   Patient is on 10000 IU Vitamin D supplement, though admits to taking  irregularly   Lab Results  Component Value Date   VD25OH 20 (L) 01/21/2018        Current Medications:  Current Outpatient Medications on File Prior to Visit  Medication Sig  . bisoprolol-hydrochlorothiazide (ZIAC) 10-6.25 MG tablet Take 1 tablet every morning for BP  . Cholecalciferol 10000 units CAPS Take 1 capsule by mouth daily.  Marland Kitchen lisinopril (PRINIVIL,ZESTRIL) 40 MG tablet Take 1 tablet at night  for BP  . meloxicam (MOBIC) 15 MG tablet Take one daily with food for 2 weeks, can take with tylenol, can not take with aleve, iburpofen, then as needed daily for pain  . minoxidil (LONITEN) 10 MG tablet Take 1/2 to 1 tablet every morning for BP  . rosuvastatin (CRESTOR) 40 MG tablet Take 1/2 to 1 tablet daily or as directed for Cholesterol  . traZODone (DESYREL) 50 MG tablet 1/2-1 tablet for sleep   No current facility-administered medications on file prior to visit.      Allergies: No Known Allergies   Medical History:  Past Medical History:  Diagnosis Date  . ADD (attention deficit disorder)   . Anxiety   . Hypertension   . Hypogonadism male   . Thyroiditis   . Vitamin D deficiency    Family history- Reviewed and unchanged Social history- Reviewed and unchanged   Review of Systems:  Review of Systems  Constitutional: Negative for malaise/fatigue and weight loss.  HENT: Negative for hearing loss and tinnitus.   Eyes: Negative for blurred vision and double vision.  Respiratory: Negative for cough, shortness of breath and wheezing.   Cardiovascular: Negative for chest pain, palpitations, orthopnea, claudication and leg swelling.  Gastrointestinal: Negative for abdominal pain, blood in stool, constipation, diarrhea, heartburn, melena, nausea and vomiting.  Genitourinary: Negative.   Musculoskeletal: Negative for joint pain and myalgias.  Skin: Negative for rash.  Neurological: Negative for dizziness, tingling, sensory change, weakness and headaches.   Endo/Heme/Allergies: Negative for polydipsia.  Psychiatric/Behavioral: Negative.   All other systems reviewed and are negative.    Physical Exam: BP 138/88   Pulse 61   Temp (!) 97.3 F (36.3 C)   Ht 6\' 5"  (1.956 m)   Wt 282 lb (127.9 kg)   SpO2 92%   BMI 33.44 kg/m  Wt Readings from Last 3 Encounters:  05/01/18 282 lb (127.9 kg)  01/21/18 288 lb 9.6 oz (130.9 kg)  11/12/17 295 lb 9.6 oz (134.1 kg)   General Appearance: Well nourished, in no apparent distress. Eyes: PERRLA, EOMs, conjunctiva no swelling or erythema Sinuses: No Frontal/maxillary tenderness ENT/Mouth: Ext aud canals clear, TMs without erythema, bulging. No erythema, swelling, or exudate on post pharynx.  Tonsils not swollen or erythematous. Hearing normal.  Neck: Supple, thyroid normal.  Respiratory: Respiratory effort normal, BS equal bilaterally without rales, rhonchi, wheezing or stridor.  Cardio: RRR with no MRGs. Brisk peripheral pulses without edema.  Abdomen: Soft, + BS.  Non tender, no guarding, rebound, hernias, masses. Lymphatics: Non tender without lymphadenopathy.  Musculoskeletal: Full ROM, 5/5 strength, Normal gait Skin: Warm, dry without rashes, lesions, ecchymosis.  Neuro: Cranial nerves intact. No cerebellar symptoms.  Psych: Awake and oriented X 3, normal affect, Insight and Judgment appropriate.    Morrie Sheldon  Osker Mason, NP 9:42 AM [facility]

## 2018-05-01 ENCOUNTER — Encounter: Payer: Self-pay | Admitting: Adult Health

## 2018-05-01 ENCOUNTER — Ambulatory Visit: Payer: BLUE CROSS/BLUE SHIELD | Admitting: Adult Health

## 2018-05-01 VITALS — BP 138/88 | HR 61 | Temp 97.3°F | Ht 77.0 in | Wt 282.0 lb

## 2018-05-01 DIAGNOSIS — E669 Obesity, unspecified: Secondary | ICD-10-CM

## 2018-05-01 DIAGNOSIS — Z79899 Other long term (current) drug therapy: Secondary | ICD-10-CM

## 2018-05-01 DIAGNOSIS — E782 Mixed hyperlipidemia: Secondary | ICD-10-CM

## 2018-05-01 DIAGNOSIS — E559 Vitamin D deficiency, unspecified: Secondary | ICD-10-CM | POA: Diagnosis not present

## 2018-05-01 DIAGNOSIS — R7309 Other abnormal glucose: Secondary | ICD-10-CM

## 2018-05-01 DIAGNOSIS — F419 Anxiety disorder, unspecified: Secondary | ICD-10-CM

## 2018-05-01 DIAGNOSIS — I10 Essential (primary) hypertension: Secondary | ICD-10-CM

## 2018-05-01 MED ORDER — SILDENAFIL CITRATE 25 MG PO TABS: 25.0000 mg | ORAL_TABLET | Freq: Every day | ORAL | 0 refills | Status: DC | PRN

## 2018-05-01 NOTE — Patient Instructions (Signed)
Goals    . Blood Pressure < 130/80        HYPERTENSION INFORMATION  Monitor your blood pressure at home, please keep a record and bring that in with you to your next office visit.   Go to the ER if any CP, SOB, nausea, dizziness, severe HA, changes vision/speech  Lower blood pressure is associated with LESS DEMENTIA, LESS HEART ATTACKS, AND LESS STROKES, AS WELL AS OVERALL DECREASED MORTALITY OR DEATH RATE.   Ideally, our goal BP is the top number of 120, but let's start by aiming to get <130/80  Your most recent BP: BP: 138/88   Take your medications faithfully as instructed. Maintain a healthy weight. Get at least 150 minutes of aerobic exercise per week. Minimize salt intake. Minimize alcohol intake  DASH Eating Plan DASH stands for "Dietary Approaches to Stop Hypertension." The DASH eating plan is a healthy eating plan that has been shown to reduce high blood pressure (hypertension). Additional health benefits may include reducing the risk of type 2 diabetes mellitus, heart disease, and stroke. The DASH eating plan may also help with weight loss. WHAT DO I NEED TO KNOW ABOUT THE DASH EATING PLAN? For the DASH eating plan, you will follow these general guidelines:  Choose foods with a percent daily value for sodium of less than 5% (as listed on the food label).  Use salt-free seasonings or herbs instead of table salt or sea salt.  Check with your health care provider or pharmacist before using salt substitutes.  Eat lower-sodium products, often labeled as "lower sodium" or "no salt added."  Eat fresh foods.  Eat more vegetables, fruits, and low-fat dairy products.  Choose whole grains. Look for the word "whole" as the first word in the ingredient list.  Choose fish and skinless chicken or [place] more often than red meat. Limit fish, poultry, and meat to 6 oz (170 g) each day.  Limit sweets, desserts, sugars, and sugary drinks.  Choose heart-healthy fats.  Limit  cheese to 1 oz (28 g) per day.  Eat more home-cooked food and less restaurant, buffet, and fast food.  Limit fried foods.  Cook foods using methods other than frying.  Limit canned vegetables. If you do use them, rinse them well to decrease the sodium.  When eating at a restaurant, ask that your food be prepared with less salt, or no salt if possible. WHAT FOODS CAN I EAT? Seek help from a dietitian for individual calorie needs. Grains Whole grain or whole wheat bread. Brown rice. Whole grain or whole wheat pasta. Quinoa, bulgur, and whole grain cereals. Low-sodium cereals. Corn or whole wheat flour tortillas. Whole grain cornbread. Whole grain crackers. Low-sodium crackers. Vegetables Fresh or frozen vegetables (raw, steamed, roasted, or grilled). Low-sodium or reduced-sodium tomato and vegetable juices. Low-sodium or reduced-sodium tomato sauce and paste. Low-sodium or reduced-sodium canned vegetables.  Fruits All fresh, canned (in natural juice), or frozen fruits. Meat and Other Protein Products Ground beef (85% or leaner), grass-fed beef, or beef trimmed of fat. Skinless chicken or [place]. Ground chicken or [place]. Pork trimmed of fat. All fish and seafood. Eggs. Dried beans, peas, or lentils. Unsalted nuts and seeds. Unsalted canned beans. Dairy Low-fat dairy products, such as skim or 1% milk, 2% or reduced-fat cheeses, low-fat ricotta or cottage cheese, or plain low-fat yogurt. Low-sodium or reduced-sodium cheeses. Fats and Oils Tub margarines without trans fats. Light or reduced-fat mayonnaise and salad dressings (reduced sodium). Avocado. Safflower, olive, or canola oils. Natural  peanut or almond butter. Other Unsalted popcorn and pretzels. The items listed above may not be a complete list of recommended foods or beverages. Contact your dietitian for more options. WHAT FOODS ARE NOT RECOMMENDED? Grains White bread. White pasta. White rice. Refined cornbread. Bagels and  croissants. Crackers that contain trans fat. Vegetables Creamed or fried vegetables. Vegetables in a cheese sauce. Regular canned vegetables. Regular canned tomato sauce and paste. Regular tomato and vegetable juices. Fruits Dried fruits. Canned fruit in light or heavy syrup. Fruit juice. Meat and Other Protein Products Fatty cuts of meat. Ribs, chicken wings, bacon, sausage, bologna, salami, chitterlings, fatback, hot dogs, bratwurst, and packaged luncheon meats. Salted nuts and seeds. Canned beans with salt. Dairy Whole or 2% milk, cream, half-and-half, and cream cheese. Whole-fat or sweetened yogurt. Full-fat cheeses or blue cheese. Nondairy creamers and whipped toppings. Processed cheese, cheese spreads, or cheese curds. Condiments Onion and garlic salt, seasoned salt, table salt, and sea salt. Canned and packaged gravies. Worcestershire sauce. Tartar sauce. Barbecue sauce. Teriyaki sauce. Soy sauce, including reduced sodium. Steak sauce. Fish sauce. Oyster sauce. Cocktail sauce. Horseradish. Ketchup and mustard. Meat flavorings and tenderizers. Bouillon cubes. Hot sauce. Tabasco sauce. Marinades. Taco seasonings. Relishes. Fats and Oils Butter, stick margarine, lard, shortening, ghee, and bacon fat. Coconut, palm kernel, or palm oils. Regular salad dressings. Other Pickles and olives. Salted popcorn and pretzels. The items listed above may not be a complete list of foods and beverages to avoid. Contact your dietitian for more information. WHERE CAN I FIND MORE INFORMATION? National Heart, Lung, and Blood Institute: [employer] Document Released: 07/05/2011 Document Revised: 11/30/2013 Document Reviewed: 05/20/2013 [facility] Patient Information 2015 [place], [place]. This information is not intended to replace advice given to you by your health care provider. Make sure you discuss any questions you have with your health care provider.

## 2018-05-02 ENCOUNTER — Other Ambulatory Visit: Payer: Self-pay | Admitting: Adult Health

## 2018-05-02 DIAGNOSIS — R7989 Other specified abnormal findings of blood chemistry: Secondary | ICD-10-CM

## 2018-05-02 DIAGNOSIS — K760 Fatty (change of) liver, not elsewhere classified: Secondary | ICD-10-CM | POA: Insufficient documentation

## 2018-05-02 DIAGNOSIS — R945 Abnormal results of liver function studies: Principal | ICD-10-CM

## 2018-05-02 LAB — COMPLETE METABOLIC PANEL WITH GFR
AG Ratio: 1.9 (calc) (ref 1.0–2.5)
ALBUMIN MSPROF: 5.2 g/dL — AB (ref 3.6–5.1)
ALT: 64 U/L — ABNORMAL HIGH (ref 9–46)
AST: 46 U/L — ABNORMAL HIGH (ref 10–40)
Alkaline phosphatase (APISO): 59 U/L (ref 40–115)
BILIRUBIN TOTAL: 0.7 mg/dL (ref 0.2–1.2)
BUN: 13 mg/dL (ref 7–25)
CHLORIDE: 102 mmol/L (ref 98–110)
CO2: 25 mmol/L (ref 20–32)
Calcium: 9.7 mg/dL (ref 8.6–10.3)
Creat: 1.13 mg/dL (ref 0.60–1.35)
GFR, EST AFRICAN AMERICAN: 91 mL/min/{1.73_m2} (ref >=60)
GFR, Est Non African American: 79 mL/min/{1.73_m2} (ref >=60)
GLOBULIN: 2.7 g/dL (ref 1.9–3.7)
GLUCOSE: 125 mg/dL — AB (ref 65–99)
Potassium: 5 mmol/L (ref 3.5–5.3)
Sodium: 137 mmol/L (ref 135–146)
TOTAL PROTEIN: 7.9 g/dL (ref 6.1–8.1)

## 2018-05-02 LAB — CBC WITH DIFFERENTIAL/PLATELET
BASOS ABS: 96 {cells}/uL (ref 0–200)
Basophils Relative: 1.2 %
EOS PCT: 2 %
Eosinophils Absolute: 160 cells/uL (ref 15–500)
HEMATOCRIT: 45.4 % (ref 38.5–50.0)
Hemoglobin: 15.7 g/dL (ref 13.2–17.1)
Lymphs Abs: 1248 cells/uL (ref 850–3900)
MCH: 33 pg (ref 27.0–33.0)
MCHC: 34.6 g/dL (ref 32.0–36.0)
MCV: 95.4 fL (ref 80.0–100.0)
MPV: 9.7 fL (ref 7.5–12.5)
Monocytes Relative: 11.3 %
NEUTROS PCT: 69.9 %
Neutro Abs: 5592 cells/uL (ref 1500–7800)
Platelets: 347 10*3/uL (ref 140–400)
RBC: 4.76 10*6/uL (ref 4.20–5.80)
RDW: 12.7 % (ref 11.0–15.0)
Total Lymphocyte: 15.6 %
WBC mixed population: 904 cells/uL (ref 200–950)
WBC: 8 10*3/uL (ref 3.8–10.8)

## 2018-05-02 LAB — LIPID PANEL
Cholesterol: 176 mg/dL (ref <200)
HDL: 64 mg/dL (ref >40)
LDL CHOLESTEROL (CALC): 97 mg/dL
Non-HDL Cholesterol (Calc): 112 mg/dL (calc) (ref <130)
Total CHOL/HDL Ratio: 2.8 (calc) (ref <5.0)
Triglycerides: 65 mg/dL (ref <150)

## 2018-05-02 LAB — VITAMIN D 25 HYDROXY (VIT D DEFICIENCY, FRACTURES): Vit D, 25-Hydroxy: 29 ng/mL — ABNORMAL LOW (ref 30–100)

## 2018-05-02 LAB — TSH: TSH: 1.13 mIU/L (ref 0.40–4.50)

## 2018-06-04 ENCOUNTER — Other Ambulatory Visit: Payer: Self-pay

## 2018-06-12 ENCOUNTER — Other Ambulatory Visit: Payer: Self-pay

## 2018-07-03 ENCOUNTER — Other Ambulatory Visit (INDEPENDENT_AMBULATORY_CARE_PROVIDER_SITE_OTHER): Payer: BLUE CROSS/BLUE SHIELD

## 2018-07-03 DIAGNOSIS — R7989 Other specified abnormal findings of blood chemistry: Secondary | ICD-10-CM

## 2018-07-03 DIAGNOSIS — R945 Abnormal results of liver function studies: Principal | ICD-10-CM

## 2018-07-03 DIAGNOSIS — E782 Mixed hyperlipidemia: Secondary | ICD-10-CM | POA: Diagnosis not present

## 2018-07-04 ENCOUNTER — Other Ambulatory Visit: Payer: Self-pay | Admitting: Adult Health

## 2018-07-04 DIAGNOSIS — R7989 Other specified abnormal findings of blood chemistry: Secondary | ICD-10-CM

## 2018-07-04 DIAGNOSIS — R945 Abnormal results of liver function studies: Principal | ICD-10-CM

## 2018-07-04 LAB — HEPATIC FUNCTION PANEL
AG RATIO: 2 (calc) (ref 1.0–2.5)
ALKALINE PHOSPHATASE (APISO): 62 U/L (ref 40–115)
ALT: 94 U/L — ABNORMAL HIGH (ref 9–46)
AST: 65 U/L — AB (ref 10–40)
Albumin: 5.1 g/dL (ref 3.6–5.1)
BILIRUBIN DIRECT: 0.3 mg/dL — AB (ref 0.0–0.2)
BILIRUBIN TOTAL: 1.1 mg/dL (ref 0.2–1.2)
Globulin: 2.6 g/dL (calc) (ref 1.9–3.7)
Indirect Bilirubin: 0.8 mg/dL (calc) (ref 0.2–1.2)
Total Protein: 7.7 g/dL (ref 6.1–8.1)

## 2018-07-04 LAB — GAMMA GT: GGT: 112 U/L — ABNORMAL HIGH (ref 3–95)

## 2018-07-04 LAB — TEST AUTHORIZATION

## 2018-07-10 ENCOUNTER — Ambulatory Visit
Admission: RE | Admit: 2018-07-10 | Discharge: 2018-07-10 | Disposition: A | Discharge status: Home / Self Care | Payer: BLUE CROSS/BLUE SHIELD | Source: Ambulatory Visit | Attending: Adult Health | Admitting: Adult Health

## 2018-07-10 ENCOUNTER — Encounter: Payer: Self-pay | Admitting: Adult Health

## 2018-07-10 DIAGNOSIS — R945 Abnormal results of liver function studies: Principal | ICD-10-CM

## 2018-07-10 DIAGNOSIS — R7989 Other specified abnormal findings of blood chemistry: Secondary | ICD-10-CM

## 2018-07-21 ENCOUNTER — Other Ambulatory Visit: Payer: Self-pay | Admitting: Internal Medicine

## 2018-07-21 DIAGNOSIS — I10 Essential (primary) hypertension: Secondary | ICD-10-CM

## 2018-08-08 ENCOUNTER — Ambulatory Visit: Payer: Self-pay | Admitting: Internal Medicine

## 2018-08-13 ENCOUNTER — Encounter: Payer: Self-pay | Admitting: Internal Medicine

## 2018-08-13 NOTE — Patient Instructions (Signed)

## 2018-08-13 NOTE — Progress Notes (Signed)
          N  O     S  H  O  W                                                                                                                                                                                                                                                           This very nice 42 y.o.male presents for 6 month follow up with HTN, HLD, Pre-Diabetes and Vitamin D Deficiency.      Patient is treated for HTN & BP has been controlled at home. Today's  . Patient has had no complaints of any cardiac type chest pain, palpitations, dyspnea / orthopnea / PND, dizziness, claudication, or dependent edema.     Hyperlipidemia is controlled with diet & Crestor. Patient denies myalgias or other med SE's. Last Lipids were at goal: Lab Results  Component Value Date   CHOL 176 05/01/2018   HDL 64 05/01/2018   LDLCALC 97 05/01/2018   TRIG 65 05/01/2018   CHOLHDL 2.8 05/01/2018      Also, the patient has history of PreDiabetes and has had no symptoms of reactive hypoglycemia, diabetic polys, paresthesias or visual blurring.  Last A1c was Normal : Lab Results  Component Value Date   HGBA1C 5.3 01/21/2018      Further, the patient also has history of Vitamin D Deficiency and supplements vitamin D without any suspected side-effects. Last vitamin D was still low :   Lab Results  Component Value Date   VD25OH 29 (L) 05/01/2018

## 2018-08-14 ENCOUNTER — Ambulatory Visit: Payer: Self-pay | Admitting: Internal Medicine

## 2018-08-17 ENCOUNTER — Encounter: Payer: Self-pay | Admitting: Internal Medicine

## 2018-08-17 NOTE — Progress Notes (Signed)
        R  E  S  C  H  E  D  U  L  E D                                                                                                                                                                                                                                                                                  This very nice 43 y.o. WWM presents for 6 month follow up with HTN, HLD, Pre-Diabetes and Vitamin D Deficiency.      Patient is treated for HTN (2007) & BP has been controlled at home. Today's  . Patient has had no complaints of any cardiac type chest pain, palpitations, dyspnea / orthopnea / PND, dizziness, claudication, or dependent edema.     Hyperlipidemia is controlled with diet & meds. Patient denies myalgias or other med SE's. Last Lipids were  Lab Results  Component Value Date   CHOL 176 05/01/2018   HDL 64 05/01/2018   LDLCALC 97 05/01/2018   TRIG 65 05/01/2018   CHOLHDL 2.8 05/01/2018      Also, the patient has Morbid Obesity (BMI 34+) & history of PreDiabetes (A1c5.7% / 2012)  and has had no symptoms of reactive hypoglycemia, diabetic polys, paresthesias or visual blurring.  Last A1c was Normal & at goal: Lab Results  Component Value Date   HGBA1C 5.3 01/21/2018      Further, the patient has history of  Vitamin D Deficiency ("39" / 2012)  and supplements vitamin D without any suspected side-effects. Last vitamin D was still very low: Lab Results  Component Value Date   VD25OH 29 (L) 05/01/2018

## 2018-08-18 ENCOUNTER — Ambulatory Visit: Payer: Self-pay | Admitting: Internal Medicine

## 2018-08-18 DIAGNOSIS — I10 Essential (primary) hypertension: Secondary | ICD-10-CM

## 2018-08-21 ENCOUNTER — Other Ambulatory Visit: Payer: Self-pay | Admitting: Internal Medicine

## 2018-08-21 DIAGNOSIS — I10 Essential (primary) hypertension: Secondary | ICD-10-CM

## 2018-08-23 ENCOUNTER — Other Ambulatory Visit: Payer: Self-pay | Admitting: Adult Health

## 2018-08-26 ENCOUNTER — Encounter: Payer: Self-pay | Admitting: Internal Medicine

## 2018-08-26 NOTE — Progress Notes (Addendum)
This very nice 43 y.o. WWM presents for 6 month follow up with HTN, HLD, Pre-Diabetes and Vitamin D Deficiency.      Patient is treated for HTN (2007) & BP has been controlled at home. Today's BP is very elevated at 196/120 & rechecked at 180/110.  Patient has had no complaints of any cardiac type chest pain, palpitations, dyspnea / orthopnea / PND, dizziness, claudication, or dependent edema.     Hyperlipidemia is controlled with diet & meds. Patient denies myalgias or other med SE's. Last Lipids were at goal: Lab Results  Component Value Date   CHOL 217 (H) 08/27/2018   HDL 63 08/27/2018   LDLCALC 137 (H) 08/27/2018   TRIG 76 08/27/2018   CHOLHDL 3.4 08/27/2018      Also, the patient has Morbid Obesity (BMI 34+) and history of PreDiabetes  (A1c5.7% / 2012) and has had no symptoms of reactive hypoglycemia, diabetic polys, paresthesias or visual blurring.  Last A1c was Normal & at goal: Lab Results  Component Value Date   HGBA1C 5.4 08/27/2018      Further, the patient also has history of Vitamin D Deficiency("39" / 2012) and supplements vitamin D without any suspected side-effects. Last vitamin D was still very low: Lab Results  Component Value Date   VD25OH 11 (L) 08/27/2018   Current Outpatient Medications on File Prior to Visit  Medication Sig  . bisoprolol-hydrochlorothiazide (ZIAC) 10-6.25 MG tablet TAKE 1 TABLET BY MOUTH EACH MORNING FOR BLOOD PRESSURE  . Cholecalciferol 10000 units CAPS Take 1 capsule by mouth daily.  Marland Kitchen lisinopril (PRINIVIL,ZESTRIL) 40 MG tablet Take 1 tablet at night  for BP  . meloxicam (MOBIC) 15 MG tablet Take one daily with food for 2 weeks, can take with tylenol, can not take with aleve, iburpofen, then as needed daily for pain  . rosuvastatin (CRESTOR) 40 MG tablet Take 1/2 to 1 tablet daily or as directed for Cholesterol  . traZODone (DESYREL) 50 MG tablet 1/2-1 tablet for sleep   No current facility-administered medications on file prior to  visit.    No Known Allergies   PMHx:   Past Medical History:  Diagnosis Date  . ADD (attention deficit disorder)   . Anxiety   . Hypertension   . Hypogonadism male   . Thyroiditis   . Vitamin D deficiency    Immunization History  Administered Date(s) Administered  . Influenza Split 04/29/2015  . PPD Test 09/23/2014, 01/21/2018  . Tdap 09/23/2014   History reviewed. No pertinent surgical history.   Marland KitchenFHx:    Reviewed / unchanged  SHx:    Reviewed / unchanged   Systems Review:  Constitutional: Denies fever, chills, wt changes, headaches, insomnia, fatigue, night sweats, change in appetite. Eyes: Denies redness, blurred vision, diplopia, discharge, itchy, watery eyes.  ENT: Denies discharge, congestion, post nasal drip, epistaxis, sore throat, earache, hearing loss, dental pain, tinnitus, vertigo, sinus pain, snoring.  CV: Denies chest pain, palpitations, irregular heartbeat, syncope, dyspnea, diaphoresis, orthopnea, PND, claudication or edema. Respiratory: denies cough, dyspnea, DOE, pleurisy, hoarseness, laryngitis, wheezing.  Gastrointestinal: Denies dysphagia, odynophagia, heartburn, reflux, water brash, abdominal pain or cramps, nausea, vomiting, bloating, diarrhea, constipation, hematemesis, melena, hematochezia  or hemorrhoids. Genitourinary: Denies dysuria, frequency, urgency, nocturia, hesitancy, discharge, hematuria or flank pain. Musculoskeletal: Denies arthralgias, myalgias, stiffness, jt. swelling, pain, limping or strain/sprain.  Skin: Denies pruritus, rash, hives, warts, acne, eczema or change in skin lesion(s). Neuro: No weakness, tremor, incoordination, spasms, paresthesia or pain. Psychiatric:  Denies confusion, memory loss or sensory loss. Endo: Denies change in weight, skin or hair change.  Heme/Lymph: No excessive bleeding, bruising or enlarged lymph nodes.  Physical Exam  BP (!) 196/120   Pulse (!) 54   Temp (!) 97.3 F (36.3 C)   Ht 6\' 5"  (1.956 m)    Wt 289 lb 9.6 oz (131.4 kg)   SpO2 97%   BMI 34.34 kg/m   Appears  well nourished, well groomed  and in no distress.  Eyes: PERRLA, EOMs, conjunctiva no swelling or erythema. Sinuses: No frontal/maxillary tenderness ENT/Mouth: EAC's clear, TM's nl w/o erythema, bulging. Nares clear w/o erythema, swelling, exudates. Oropharynx clear without erythema or exudates. Oral hygiene is good. Tongue normal, non obstructing. Hearing intact.  Neck: Supple. Thyroid not palpable. Car 2+/2+ without bruits, nodes or JVD. Chest: Respirations nl with BS clear & equal w/o rales, rhonchi, wheezing or stridor.  Cor: Heart sounds normal w/ regular rate and rhythm without sig. murmurs, gallops, clicks or rubs. Peripheral pulses normal and equal  without edema.  Abdomen: Soft & bowel sounds normal. Non-tender w/o guarding, rebound, hernias, masses or organomegaly.  Lymphatics: Unremarkable.  Musculoskeletal: Full ROM all peripheral extremities, joint stability, 5/5 strength and normal gait.  Skin: Warm, dry without exposed rashes, lesions or ecchymosis apparent.  Neuro: Cranial nerves intact, reflexes equal bilaterally. Sensory-motor testing grossly intact. Tendon reflexes grossly intact.  Pysch: Alert & oriented x 3.  Insight and judgement nl & appropriate. No ideations.  Assessment and Plan:  1. Essential hypertension  - Add Rx - Minoxidil 10 mg tab daily & ROV 1 week for recheck.   - Continue medication, monitor blood pressure at home.  - Continue DASH diet.  Reminder to go to the ER if any CP,  SOB, nausea, dizziness, severe HA, changes vision/speech.  - CBC with Differential/Platelet - COMPLETE METABOLIC PANEL WITH GFR - Magnesium - TSH  2. Hyperlipidemia, mixed  - Continue diet/meds, exercise,& lifestyle modifications.  - Continue monitor periodic cholesterol/liver & renal functions   - Lipid panel - TSH  3. Prediabetes  - Continue diet, exercise  - Lifestyle modifications.  - Monitor  appropriate labs.  - Hemoglobin A1c - Insulin, random  4. Vitamin D deficiency  - Continue supplementation.  - VITAMIN D 25 Hydroxyl  5. Medication management  - CBC with Differential/Platelet - COMPLETE METABOLIC PANEL WITH GFR - Magnesium - Lipid panel - TSH - Hemoglobin A1c - Insulin, random - VITAMIN D 25 Hydroxy        Discussed  regular exercise, BP monitoring, weight control to achieve/maintain BMI less than 25 and discussed med and SE's. Recommended labs to assess and monitor clinical status with further disposition pending results of labs. Over 30 minutes of exam, counseling, chart review was performed.

## 2018-08-26 NOTE — Patient Instructions (Signed)

## 2018-08-27 ENCOUNTER — Encounter: Payer: Self-pay | Admitting: Internal Medicine

## 2018-08-27 ENCOUNTER — Ambulatory Visit (INDEPENDENT_AMBULATORY_CARE_PROVIDER_SITE_OTHER): Payer: BLUE CROSS/BLUE SHIELD | Admitting: Internal Medicine

## 2018-08-27 VITALS — BP 196/120 | HR 54 | Temp 97.3°F | Ht 77.0 in | Wt 289.6 lb

## 2018-08-27 DIAGNOSIS — E559 Vitamin D deficiency, unspecified: Secondary | ICD-10-CM

## 2018-08-27 DIAGNOSIS — Z79899 Other long term (current) drug therapy: Secondary | ICD-10-CM

## 2018-08-27 DIAGNOSIS — E782 Mixed hyperlipidemia: Secondary | ICD-10-CM | POA: Diagnosis not present

## 2018-08-27 DIAGNOSIS — I10 Essential (primary) hypertension: Secondary | ICD-10-CM | POA: Diagnosis not present

## 2018-08-27 DIAGNOSIS — R7303 Prediabetes: Secondary | ICD-10-CM | POA: Diagnosis not present

## 2018-08-27 DIAGNOSIS — N529 Male erectile dysfunction, unspecified: Secondary | ICD-10-CM

## 2018-08-27 MED ORDER — MINOXIDIL 10 MG PO TABS: ORAL_TABLET | ORAL | 1 refills | Status: DC

## 2018-08-27 MED ORDER — SILDENAFIL CITRATE 100 MG PO TABS: 100.0000 mg | ORAL_TABLET | Freq: Every day | ORAL | 3 refills | Status: DC | PRN

## 2018-08-28 ENCOUNTER — Other Ambulatory Visit: Payer: Self-pay | Admitting: Internal Medicine

## 2018-08-28 DIAGNOSIS — E782 Mixed hyperlipidemia: Secondary | ICD-10-CM

## 2018-08-28 LAB — CBC WITH DIFFERENTIAL/PLATELET
Absolute Monocytes: 1178 cells/uL — ABNORMAL HIGH (ref 200–950)
BASOS ABS: 147 {cells}/uL (ref 0–200)
Basophils Relative: 1.6 %
Eosinophils Absolute: 193 cells/uL (ref 15–500)
Eosinophils Relative: 2.1 %
HCT: 43.8 % (ref 38.5–50.0)
HEMOGLOBIN: 14.9 g/dL (ref 13.2–17.1)
Lymphs Abs: 1757 cells/uL (ref 850–3900)
MCH: 32.4 pg (ref 27.0–33.0)
MCHC: 34 g/dL (ref 32.0–36.0)
MCV: 95.2 fL (ref 80.0–100.0)
MPV: 10.5 fL (ref 7.5–12.5)
Monocytes Relative: 12.8 %
NEUTROS ABS: 5925 {cells}/uL (ref 1500–7800)
Neutrophils Relative %: 64.4 %
Platelets: 271 10*3/uL (ref 140–400)
RBC: 4.6 10*6/uL (ref 4.20–5.80)
RDW: 12.4 % (ref 11.0–15.0)
Total Lymphocyte: 19.1 %
WBC: 9.2 10*3/uL (ref 3.8–10.8)

## 2018-08-28 LAB — COMPLETE METABOLIC PANEL WITH GFR
AG Ratio: 2 (calc) (ref 1.0–2.5)
ALKALINE PHOSPHATASE (APISO): 54 U/L (ref 40–115)
ALT: 40 U/L (ref 9–46)
AST: 32 U/L (ref 10–40)
Albumin: 4.7 g/dL (ref 3.6–5.1)
BUN: 10 mg/dL (ref 7–25)
CO2: 28 mmol/L (ref 20–32)
CREATININE: 1.01 mg/dL (ref 0.60–1.35)
Calcium: 9.4 mg/dL (ref 8.6–10.3)
Chloride: 97 mmol/L — ABNORMAL LOW (ref 98–110)
GFR, Est African American: 104 mL/min/{1.73_m2} (ref >=60)
GFR, Est Non African American: 90 mL/min/{1.73_m2} (ref >=60)
Globulin: 2.4 g/dL (calc) (ref 1.9–3.7)
Glucose, Bld: 92 mg/dL (ref 65–99)
Potassium: 4.1 mmol/L (ref 3.5–5.3)
Sodium: 134 mmol/L — ABNORMAL LOW (ref 135–146)
Total Bilirubin: 0.6 mg/dL (ref 0.2–1.2)
Total Protein: 7.1 g/dL (ref 6.1–8.1)

## 2018-08-28 LAB — TSH: TSH: 2.1 mIU/L (ref 0.40–4.50)

## 2018-08-28 LAB — LIPID PANEL
Cholesterol: 217 mg/dL — ABNORMAL HIGH (ref <200)
HDL: 63 mg/dL (ref >40)
LDL Cholesterol (Calc): 137 mg/dL (calc) — ABNORMAL HIGH
Non-HDL Cholesterol (Calc): 154 mg/dL (calc) — ABNORMAL HIGH (ref <130)
Total CHOL/HDL Ratio: 3.4 (calc) (ref <5.0)
Triglycerides: 76 mg/dL (ref <150)

## 2018-08-28 LAB — MAGNESIUM: MAGNESIUM: 1.7 mg/dL (ref 1.5–2.5)

## 2018-08-28 LAB — INSULIN, RANDOM: Insulin: 10.3 u[IU]/mL (ref 2.0–19.6)

## 2018-08-28 LAB — VITAMIN D 25 HYDROXY (VIT D DEFICIENCY, FRACTURES): Vit D, 25-Hydroxy: 11 ng/mL — ABNORMAL LOW (ref 30–100)

## 2018-08-28 LAB — HEMOGLOBIN A1C
Hgb A1c MFr Bld: 5.4 % of total Hgb (ref <5.7)
MEAN PLASMA GLUCOSE: 108 (calc)
eAG (mmol/L): 6 (calc)

## 2018-08-28 MED ORDER — EZETIMIBE 10 MG PO TABS: 10.0000 mg | ORAL_TABLET | Freq: Every day | ORAL | 1 refills | Status: DC

## 2018-09-22 ENCOUNTER — Encounter: Payer: Self-pay | Admitting: Internal Medicine

## 2018-09-22 NOTE — Progress Notes (Signed)
       C  A N  C  E  L  L  E  D    At                 A  P  P  'T     T  I  M  E            Subjective:    Patient ID: Cameron Donovan, male    DOB: 1975/08/22, 43 y.o.   MRN: [id number]  HPI   Patient is a 43 yo WWM with severe labile HTN who returns for 3 week f/u of recently elevated BP of 196/120 and 180 /110 and he had Minoxidil 10 mg added with his Ziac-10 and Lisinopril 40 mg.

## 2018-09-23 ENCOUNTER — Ambulatory Visit: Payer: Self-pay | Admitting: Internal Medicine

## 2018-10-06 ENCOUNTER — Ambulatory Visit (INDEPENDENT_AMBULATORY_CARE_PROVIDER_SITE_OTHER): Payer: BC Managed Care – PPO | Admitting: Internal Medicine

## 2018-10-06 DIAGNOSIS — Z5329 Procedure and treatment not carried out because of patient's decision for other reasons: Secondary | ICD-10-CM

## 2018-10-06 NOTE — Progress Notes (Signed)
NO SHOW

## 2018-10-08 ENCOUNTER — Ambulatory Visit: Payer: Self-pay | Admitting: Internal Medicine

## 2018-10-09 NOTE — Progress Notes (Signed)
NO SHOW

## 2018-11-24 ENCOUNTER — Other Ambulatory Visit: Payer: BLUE CROSS/BLUE SHIELD | Admitting: Internal Medicine

## 2018-11-24 DIAGNOSIS — K0889 Other specified disorders of teeth and supporting structures: Secondary | ICD-10-CM | POA: Diagnosis not present

## 2018-11-24 MED ORDER — CEPHALEXIN 500 MG PO CAPS: ORAL_CAPSULE | ORAL | 0 refills | Status: DC

## 2018-11-24 MED ORDER — GABAPENTIN 300 MG PO CAPS: ORAL_CAPSULE | ORAL | 0 refills | Status: DC

## 2018-11-24 NOTE — Progress Notes (Signed)
THIS ENCOUNTER IS A VIRTUAL VISIT DUE TO COVID-19 - PATIENT WAS NOT SEEN IN THE OFFICE.  PATIENT HAS CONSENTED TO VIRTUAL VISIT / TELEMEDICINE VISIT  Virtual Visit via telephone Note  I connected with  Carman Ching   on 11/24/2018 11/24/2018  by telephone.  I verified that I am speaking with the correct person using two identifiers.    I discussed the limitations of evaluation and management by telemedicine and the availability of in person appointments. The patient expressed understanding and agreed to proceed.  History of Present Illness:      This very nice 43 y.o. WWM with HTN, HLD, Pre-Diabetes and Vitamin D Deficiency presents with c/o toothache. Lost a filling from a tooth last week from Rt bottom 1st molar. Temp 100.3 yesterday. Says last seen a dentist 23 years ago. Medications   Current Outpatient Medications (Cardiovascular):  .  bisoprolol-hydrochlorothiazide (ZIAC) 10-6.25 MG tablet, TAKE 1 TABLET BY MOUTH EACH MORNING FOR BLOOD PRESSURE .  ezetimibe (ZETIA) 10 MG tablet, Take 1 tablet (10 mg total) by mouth daily. Marland Kitchen  lisinopril (PRINIVIL,ZESTRIL) 40 MG tablet, Take 1 tablet at night  for BP .  minoxidil (LONITEN) 10 MG tablet, Take 1 tablet daily for BP to prevent Heart Attack, Stroke & Kidney Failure .  rosuvastatin (CRESTOR) 40 MG tablet, Take 1/2 to 1 tablet daily or as directed for Cholesterol .  sildenafil (VIAGRA) 100 MG tablet, Take 1 tablet (100 mg total) by mouth daily as needed for erectile dysfunction. Take 1/2 to 1 tablet daily if needed for XXXX   Current Outpatient Medications (Analgesics):  .  meloxicam (MOBIC) 15 MG tablet, Take one daily with food for 2 weeks, can take with tylenol, can not take with aleve, iburpofen, then as needed daily for pain   Current Outpatient Medications (Other):  Marland Kitchen  Cholecalciferol 10000 units CAPS, Take 1 capsule by mouth daily. .  traZODone (DESYREL) 50 MG tablet, 1/2-1 tablet for sleep   No Known Allergies  Problem  list He has ADD (attention deficit disorder); Anxiety; Testosterone Deficiency; Vitamin D deficiency; Thyroiditis; Essential hypertension; Hyperlipidemia; Other abnormal glucose; Medication management; Obesity (BMI 30.0-34.9); and Fatty liver on their problem list.   Observations/Objective:  Reports Afebrile today  General : Well sounding patient in no apparent distress HEENT: no hoarseness, no cough for duration of visit Lungs: speaks in complete sentences, no audible wheezing, no apparent distress Neurological: alert, oriented x 3 Psychiatric: pleasant, judgement appropriate   Assessment and Plan:  1. Odontalgia  - cephALEXin (KEFLEX) 500 MG capsule; Take 1 capsule 4 x /day with food for Infection  Dispense: 40 capsule;  - gabapentin (NEURONTIN) 300 MG capsule; Take 1 capsule 3 to 4 x /day if needed for Pain  Dispense: 90 capsule; - advised Dental wax & local oral analgesics - Encouraged dental evaluation when available  Follow Up Instructions:  I discussed the assessment and treatment plan with the patient. The patient was provided an opportunity to ask questions and all were answered. The patient agreed with the plan and demonstrated an understanding of the instructions.   The patient was advised to call back or seek an in-person evaluation if the symptoms worsen or if the condition fails to improve as anticipated.  I provided 22 minutes of non-face-to-face time during this encounter.   Marinus Maw, MD

## 2018-11-29 ENCOUNTER — Other Ambulatory Visit: Payer: Self-pay | Admitting: Internal Medicine

## 2018-11-29 DIAGNOSIS — I10 Essential (primary) hypertension: Secondary | ICD-10-CM

## 2018-12-03 NOTE — Progress Notes (Deleted)
FOLLOW UP  Assessment and Plan:   Hypertension Somewhat elevated with current agents, on max doses respectably; *** Monitor blood pressure at home; patient to call if consistently greater than 130/80 Continue DASH diet.   Reminder to go to the ER if any CP, SOB, nausea, dizziness, severe HA, changes vision/speech, left arm numbness and tingling and jaw pain.  Cholesterol Currently above goal; newly on rosuvastatin, taking 40 mg 3-4 days a week Continue low cholesterol diet and exercise.  Check lipid panel.   Other abnormal glucose Recent A1Cs at goal Discussed diet/exercise, weight management  Defer A1C; check CMP  Obesity with co morbidities *** Long discussion about weight loss, diet, and exercise Recommended diet heavy in fruits and veggies and low in animal meats, cheeses, and dairy products, appropriate calorie intake Discussed ideal weight for height   Patient will work on continuing current lifestyle changes Will follow up in 3 months  Vitamin D Def Below goal at last visit;  He has initiated supplement - 10000 IU daily, though admits to taking irregularly Check Vit D level  Anxiety Remains well controlled off of medications Stress management techniques discussed, increase water, good sleep hygiene discussed, increase exercise, and increase veggies.    Continue diet and meds as discussed. Further disposition pending results of labs. Discussed med's effects and SE's.   Over 30 minutes of exam, counseling, chart review, and critical decision making was performed.   Future Appointments  Date Time Provider Department Center  12/04/2018  4:00 PM Judd Gaudierorbett, Aleece Loyd, NP [facility] None  03/10/2019  3:00 PM Lucky CowboyMcKeown, William, MD [facility] None    ----------------------------------------------------------------------------------------------------------------------  HPI 43 y.o. male  presents for 3 month follow up on hypertension, cholesterol, glucose management, obesity,  anxiety and vitamin D deficiency.   he has a diagnosis of anxiety and was previously on valium 1-2 mg once daily PRN severe anxiety, but was able to taper off and currently in remission off of medication. He is also prescribed low dose trazodone for sleep.   BMI is There is no height or weight on file to calculate BMI., he has been working on diet and exercise. He has been pushing water, 5-6 bottles daily. He is active at work, he eats 1 meal per day, cut out soft drinks, only occasionally drinks sweet tea.  Wt Readings from Last 3 Encounters:  08/27/18 289 lb 9.6 oz (131.4 kg)  05/01/18 282 lb (127.9 kg)  01/21/18 288 lb 9.6 oz (130.9 kg)   He is not currently checking BPs at home, today their BP is   after manual recheck by provider.   He does not workout but works a physically intense job. He denies chest pain, shortness of breath, dizziness.   He is on cholesterol medication Rosuvastatin 40 mg every other day, zetia 10 mg daily *** and denies myalgias. His cholesterol is not at goal. The cholesterol last visit was:   Lab Results  Component Value Date   CHOL 217 (H) 08/27/2018   HDL 63 08/27/2018   LDLCALC 137 (H) 08/27/2018   TRIG 76 08/27/2018   CHOLHDL 3.4 08/27/2018    He has been working on diet and exercise for glucose management, and denies foot ulcerations, increased appetite, nausea, paresthesia of the feet, polydipsia, polyuria and visual disturbances. Last A1C in the office was:  Lab Results  Component Value Date   HGBA1C 5.4 08/27/2018   He has hx of thyroiditis and TSHs are monitored closely:    Lab Results  Component Value  Date   TSH 2.10 08/27/2018   Patient is on 10000 IU Vitamin D supplement, though admits to taking irregularly   Lab Results  Component Value Date   VD25OH 11 (L) 08/27/2018        Current Medications:  Current Outpatient Medications on File Prior to Visit  Medication Sig  . bisoprolol-hydrochlorothiazide (ZIAC) 10-6.25 MG tablet Take 1  tablet Daily for BP - Must have Office Visit before any further refills  . cephALEXin (KEFLEX) 500 MG capsule Take 1 capsule 4 x /day with food for Infection  . Cholecalciferol 10000 units CAPS Take 1 capsule by mouth daily.  Marland Kitchen ezetimibe (ZETIA) 10 MG tablet Take 1 tablet (10 mg total) by mouth daily.  Marland Kitchen gabapentin (NEURONTIN) 300 MG capsule Take 1 capsule 3 to 4 x /day if needed for Pain  . lisinopril (PRINIVIL,ZESTRIL) 40 MG tablet Take 1 tablet at night  for BP  . meloxicam (MOBIC) 15 MG tablet Take one daily with food for 2 weeks, can take with tylenol, can not take with aleve, iburpofen, then as needed daily for pain  . minoxidil (LONITEN) 10 MG tablet Take 1 tablet daily for BP to prevent Heart Attack, Stroke & Kidney Failure  . rosuvastatin (CRESTOR) 40 MG tablet Take 1/2 to 1 tablet daily or as directed for Cholesterol  . sildenafil (VIAGRA) 100 MG tablet Take 1 tablet (100 mg total) by mouth daily as needed for erectile dysfunction. Take 1/2 to 1 tablet daily if needed for XXXX  . traZODone (DESYREL) 50 MG tablet 1/2-1 tablet for sleep   No current facility-administered medications on file prior to visit.      Allergies: No Known Allergies   Medical History:  Past Medical History:  Diagnosis Date  . ADD (attention deficit disorder)   . Anxiety   . Hypertension   . Hypogonadism male   . Thyroiditis   . Vitamin D deficiency    Family history- Reviewed and unchanged Social history- Reviewed and unchanged   Review of Systems:  Review of Systems  Constitutional: Negative for malaise/fatigue and weight loss.  HENT: Negative for hearing loss and tinnitus.   Eyes: Negative for blurred vision and double vision.  Respiratory: Negative for cough, shortness of breath and wheezing.   Cardiovascular: Negative for chest pain, palpitations, orthopnea, claudication and leg swelling.  Gastrointestinal: Negative for abdominal pain, blood in stool, constipation, diarrhea, heartburn,  melena, nausea and vomiting.  Genitourinary: Negative.   Musculoskeletal: Negative for joint pain and myalgias.  Skin: Negative for rash.  Neurological: Negative for dizziness, tingling, sensory change, weakness and headaches.  Endo/Heme/Allergies: Negative for polydipsia.  Psychiatric/Behavioral: Negative.   All other systems reviewed and are negative.    Physical Exam: There were no vitals taken for this visit. Wt Readings from Last 3 Encounters:  08/27/18 289 lb 9.6 oz (131.4 kg)  05/01/18 282 lb (127.9 kg)  01/21/18 288 lb 9.6 oz (130.9 kg)   General Appearance: Well nourished, in no apparent distress. Eyes: PERRLA, EOMs, conjunctiva no swelling or erythema Sinuses: No Frontal/maxillary tenderness ENT/Mouth: Ext aud canals clear, TMs without erythema, bulging. No erythema, swelling, or exudate on post pharynx.  Tonsils not swollen or erythematous. Hearing normal.  Neck: Supple, thyroid normal.  Respiratory: Respiratory effort normal, BS equal bilaterally without rales, rhonchi, wheezing or stridor.  Cardio: RRR with no MRGs. Brisk peripheral pulses without edema.  Abdomen: Soft, + BS.  Non tender, no guarding, rebound, hernias, masses. Lymphatics: Non tender without lymphadenopathy.  Musculoskeletal: Full ROM, 5/5 strength, Normal gait Skin: Warm, dry without rashes, lesions, ecchymosis.  Neuro: Cranial nerves intact. No cerebellar symptoms.  Psych: Awake and oriented X 3, normal affect, Insight and Judgment appropriate.    [name], NP 4:15 PM [facility]

## 2018-12-04 ENCOUNTER — Ambulatory Visit: Payer: Self-pay | Admitting: Adult Health

## 2018-12-09 NOTE — Progress Notes (Signed)
FOLLOW UP  Assessment and Plan:   Hypertension Somewhat elevated with current agents, on max doses respectably;  However today with significant pain, some question of white coat syndrome HAS SIGNIFICANTLY improved from previous  He is waiting on arrival of BP cuff to start checking at home After tooth resolved, he will start checking BP daily, and call to report 1-2 weeks worth of values If persistently above goal, will switch bisoprolol 10 mg plain and add hctz 25 mg for improved diastolic control.  Monitor blood pressure at home; patient to call if consistently greater than 140/90 Continue DASH diet.   Reminder to go to the ER if any CP, SOB, nausea, dizziness, severe HA, changes vision/speech, left arm numbness and tingling and jaw pain.  Cholesterol Currently above goal; newly on rosuvastatin, taking 40 mg 3-4 days a week, newly on zetia 10 mg daily. Increase rosuvastatin to daily if remains above goal.  Continue low cholesterol diet and exercise.  Check lipid panel.   Other abnormal glucose Recent A1Cs at goal Discussed diet/exercise, weight management  Defer A1C; check CMP  Obesity with co morbidities Long discussion about weight loss, diet, and exercise Recommended diet heavy in fruits and veggies and low in animal meats, cheeses, and dairy products, appropriate calorie intake Discussed ideal weight for height   Patient will work on continuing current lifestyle changes, portions Will follow up in 3 months  Vitamin D Def Below goal at last visit;  He has initiated supplement - 10000 IU daily, though admits to taking very irregularly Strategies for adherence discussed - he will take in the morning with BP meds Defer Vit D level to next OV  Anxiety Remains well controlled off of medications Stress management techniques discussed, increase water, good sleep hygiene discussed, increase exercise, and increase veggies.    Continue diet and meds as discussed. Further  disposition pending results of labs. Discussed med's effects and SE's.   Over 30 minutes of exam, counseling, chart review, and critical decision making was performed.   Future Appointments  Date Time Provider Department Center  03/10/2019  3:00 PM Lucky CowboyMcKeown, William, MD [facility] None    ----------------------------------------------------------------------------------------------------------------------  HPI 43 y.o. male  presents for 3 month follow up on hypertension, cholesterol, glucose management, obesity, anxiety and vitamin D deficiency.   He has cavity and has removal scheduled tomorrow, on keflex, current pain 7/10, taking ibuprofen 800 mg TID PRN.   he has a diagnosis of anxiety and was previously on valium 1-2 mg once daily PRN severe anxiety, but was able to taper off and currently in remission off of medication.  BMI is Body mass index is 33.63 kg/m., he has been working on diet and exercise. He has been pushing water, 5-6 bottles daily. He is active at work, he eats 1 meal per day, cut out soft drinks, only occasionally drinks sweet tea.  Wt Readings from Last 3 Encounters:  12/10/18 283 lb 9.6 oz (128.6 kg)  08/27/18 289 lb 9.6 oz (131.4 kg)  05/01/18 282 lb (127.9 kg)   He is not currently checking BPs at home, has ordered cuff but hasn't received yet, today their BP is BP: (!) 148/92 after manual recheck by provider, significantly improved from previous. He is taking ziac 10/6.25mg , lisinopril 40 mg, minoxidil 10 mg daily.   He does not workout but works a physically intense job. He denies chest pain, shortness of breath, dizziness.   He is on cholesterol medication Rosuvastatin 40 mg every other day, zetia  10 mg daily and denies myalgias. His cholesterol is not at goal. The cholesterol last visit was:   Lab Results  Component Value Date   CHOL 217 (H) 08/27/2018   HDL 63 08/27/2018   LDLCALC 137 (H) 08/27/2018   TRIG 76 08/27/2018   CHOLHDL 3.4 08/27/2018    He  has been working on diet and exercise for glucose management, and denies foot ulcerations, increased appetite, nausea, paresthesia of the feet, polydipsia, polyuria and visual disturbances. Last A1C in the office was:  Lab Results  Component Value Date   HGBA1C 5.4 08/27/2018   He has hx of thyroiditis and TSHs are monitored closely:    Lab Results  Component Value Date   TSH 2.10 08/27/2018   Patient is on 10000 IU Vitamin D supplement, though admits to taking irregularly   Lab Results  Component Value Date   VD25OH 11 (L) 08/27/2018       Current Medications:  Current Outpatient Medications on File Prior to Visit  Medication Sig  . bisoprolol-hydrochlorothiazide (ZIAC) 10-6.25 MG tablet Take 1 tablet Daily for BP - Must have Office Visit before any further refills  . cephALEXin (KEFLEX) 500 MG capsule Take 1 capsule 4 x /day with food for Infection  . Cholecalciferol 10000 units CAPS Take 1 capsule by mouth daily.  Marland Kitchen ezetimibe (ZETIA) 10 MG tablet Take 1 tablet (10 mg total) by mouth daily.  Marland Kitchen gabapentin (NEURONTIN) 300 MG capsule Take 1 capsule 3 to 4 x /day if needed for Pain  . ibuprofen (ADVIL) 800 MG tablet Take 800 mg by mouth every 8 (eight) hours as needed.  Marland Kitchen lisinopril (PRINIVIL,ZESTRIL) 40 MG tablet Take 1 tablet at night  for BP  . meloxicam (MOBIC) 15 MG tablet Take one daily with food for 2 weeks, can take with tylenol, can not take with aleve, iburpofen, then as needed daily for pain  . minoxidil (LONITEN) 10 MG tablet Take 1 tablet daily for BP to prevent Heart Attack, Stroke & Kidney Failure  . rosuvastatin (CRESTOR) 40 MG tablet Take 1/2 to 1 tablet daily or as directed for Cholesterol  . sildenafil (VIAGRA) 100 MG tablet Take 1 tablet (100 mg total) by mouth daily as needed for erectile dysfunction. Take 1/2 to 1 tablet daily if needed for XXXX   No current facility-administered medications on file prior to visit.      Allergies: No Known Allergies   Medical  History:  Past Medical History:  Diagnosis Date  . ADD (attention deficit disorder)   . Anxiety   . Hypertension   . Hypogonadism male   . Thyroiditis   . Vitamin D deficiency    Family history- Reviewed and unchanged Social history- Reviewed and unchanged   Review of Systems:  Review of Systems  Constitutional: Negative for malaise/fatigue and weight loss.  HENT: Negative for hearing loss and tinnitus.   Eyes: Negative for blurred vision and double vision.  Respiratory: Negative for cough, shortness of breath and wheezing.   Cardiovascular: Negative for chest pain, palpitations, orthopnea, claudication and leg swelling.  Gastrointestinal: Negative for abdominal pain, blood in stool, constipation, diarrhea, heartburn, melena, nausea and vomiting.  Genitourinary: Negative.   Musculoskeletal: Negative for joint pain and myalgias.  Skin: Negative for rash.  Neurological: Negative for dizziness, tingling, sensory change, weakness and headaches.  Endo/Heme/Allergies: Negative for polydipsia.  Psychiatric/Behavioral: Negative.   All other systems reviewed and are negative.    Physical Exam: BP (!) 148/92   Pulse  60   Temp (!) 97.2 F (36.2 C)   Ht  (1.956 m)   Wt 283 lb 9.6 oz (128.6 kg)   SpO2 97%   BMI 33.63 kg/m  Wt Readings from Last 3 Encounters:  12/10/18 283 lb 9.6 oz (128.6 kg)  08/27/18 289 lb 9.6 oz (131.4 kg)  05/01/18 282 lb (127.9 kg)   General Appearance: Well nourished, in no apparent distress. Eyes: PERRLA, EOMs, conjunctiva no swelling or erythema Sinuses: No Frontal/maxillary tenderness ENT/Mouth: Ext aud canals clear, TMs without erythema, bulging. No erythema, swelling, or exudate on post pharynx.  Tonsils not swollen or erythematous. Hearing normal. Poor dentition, R back molar dark with obvious cavity; no obvious abscess  Neck: Supple, thyroid normal.  Respiratory: Respiratory effort normal, BS equal bilaterally without rales, rhonchi, wheezing  or stridor.  Cardio: RRR with no MRGs. Brisk peripheral pulses without edema.  Abdomen: Soft, + BS.  Non tender, no guarding, rebound, hernias, masses. Lymphatics: Non tender without lymphadenopathy.  Musculoskeletal: Full ROM, 5/5 strength, Normal gait Skin: Warm, dry without rashes, lesions, ecchymosis.  Neuro: Cranial nerves intact. No cerebellar symptoms.  Psych: Awake and oriented X 3, normal affect, Insight and Judgment appropriate.    [name], NP 4:25 PM [facility]

## 2018-12-10 ENCOUNTER — Other Ambulatory Visit: Payer: Self-pay

## 2018-12-10 ENCOUNTER — Encounter: Payer: Self-pay | Admitting: Adult Health

## 2018-12-10 ENCOUNTER — Ambulatory Visit: Payer: BLUE CROSS/BLUE SHIELD | Admitting: Adult Health

## 2018-12-10 VITALS — BP 148/92 | HR 60 | Temp 97.2°F | Ht 77.0 in | Wt 283.6 lb

## 2018-12-10 DIAGNOSIS — R7309 Other abnormal glucose: Secondary | ICD-10-CM

## 2018-12-10 DIAGNOSIS — E669 Obesity, unspecified: Secondary | ICD-10-CM

## 2018-12-10 DIAGNOSIS — E069 Thyroiditis, unspecified: Secondary | ICD-10-CM | POA: Diagnosis not present

## 2018-12-10 DIAGNOSIS — I10 Essential (primary) hypertension: Secondary | ICD-10-CM

## 2018-12-10 DIAGNOSIS — E559 Vitamin D deficiency, unspecified: Secondary | ICD-10-CM

## 2018-12-10 DIAGNOSIS — F419 Anxiety disorder, unspecified: Secondary | ICD-10-CM

## 2018-12-10 DIAGNOSIS — E782 Mixed hyperlipidemia: Secondary | ICD-10-CM | POA: Diagnosis not present

## 2018-12-10 DIAGNOSIS — K760 Fatty (change of) liver, not elsewhere classified: Secondary | ICD-10-CM | POA: Diagnosis not present

## 2018-12-10 DIAGNOSIS — Z79899 Other long term (current) drug therapy: Secondary | ICD-10-CM | POA: Diagnosis not present

## 2018-12-10 NOTE — Patient Instructions (Addendum)
Goals    . Blood Pressure < 140/90      Take vitamin D in the morning with other meds so you don't forget  Can take cholesterol medication in the morning as well if this well help you remember to take consistently      After tooth ache resolved and not taking ibuprofen  Start checking blood pressure daily with new cuff, keep a log  Call office after 10-14 days to report values -   If above goal consistently, will switch from bisoprolol/hydrochlorothiazide combo to separate agents and increase the hydrochlorothiazide (diuretic)     HYPERTENSION INFORMATION  Monitor your blood pressure at home, please keep a record and bring that in with you to your next office visit.   Go to the ER if any CP, SOB, nausea, dizziness, severe HA, changes vision/speech  Testing/Procedures: HOW TO TAKE YOUR BLOOD PRESSURE:  Rest 5 minutes before taking your blood pressure.  Don't smoke or drink caffeinated beverages for at least 30 minutes before.  Take your blood pressure before (not after) you eat.  Sit comfortably with your back supported and both feet on the floor (don't cross your legs).  Elevate your arm to heart level on a table or a desk.  Use the proper sized cuff. It should fit smoothly and snugly around your bare upper arm. There should be enough room to slip a fingertip under the cuff. The bottom edge of the cuff should be 1 inch above the crease of the elbow.  There was a study that showed taking your blood pressure medications at night decrease cardiovascular events.  However if you are on a fluid pill, please take this in the morning.   Your most recent BP: BP: (!) 148/92   Take your medications faithfully as instructed. Maintain a healthy weight. Get at least 150 minutes of aerobic exercise per week. Minimize salt intake. Minimize alcohol intake  DASH Eating Plan DASH stands for "Dietary Approaches to Stop Hypertension." The DASH eating plan is a healthy eating plan  that has been shown to reduce high blood pressure (hypertension). Additional health benefits may include reducing the risk of type 2 diabetes mellitus, heart disease, and stroke. The DASH eating plan may also help with weight loss. WHAT DO I NEED TO KNOW ABOUT THE DASH EATING PLAN? For the DASH eating plan, you will follow these general guidelines:  Choose foods with a percent daily value for sodium of less than 5% (as listed on the food label).  Use salt-free seasonings or herbs instead of table salt or sea salt.  Check with your health care provider or pharmacist before using salt substitutes.  Eat lower-sodium products, often labeled as "lower sodium" or "no salt added."  Eat fresh foods.  Eat more vegetables, fruits, and low-fat dairy products.  Choose whole grains. Look for the word "whole" as the first word in the ingredient list.  Choose fish and skinless chicken or [place] more often than red meat. Limit fish, poultry, and meat to 6 oz (170 g) each day.  Limit sweets, desserts, sugars, and sugary drinks.  Choose heart-healthy fats.  Limit cheese to 1 oz (28 g) per day.  Eat more home-cooked food and less restaurant, buffet, and fast food.  Limit fried foods.  Cook foods using methods other than frying.  Limit canned vegetables. If you do use them, rinse them well to decrease the sodium.  When eating at a restaurant, ask that your food be prepared with less salt,  or no salt if possible. WHAT FOODS CAN I EAT? Seek help from a dietitian for individual calorie needs. Grains Whole grain or whole wheat bread. Brown rice. Whole grain or whole wheat pasta. Quinoa, bulgur, and whole grain cereals. Low-sodium cereals. Corn or whole wheat flour tortillas. Whole grain cornbread. Whole grain crackers. Low-sodium crackers. Vegetables Fresh or frozen vegetables (raw, steamed, roasted, or grilled). Low-sodium or reduced-sodium tomato and vegetable juices. Low-sodium or reduced-sodium  tomato sauce and paste. Low-sodium or reduced-sodium canned vegetables.  Fruits All fresh, canned (in natural juice), or frozen fruits. Meat and Other Protein Products Ground beef (85% or leaner), grass-fed beef, or beef trimmed of fat. Skinless chicken or [place]. Ground chicken or [place]. Pork trimmed of fat. All fish and seafood. Eggs. Dried beans, peas, or lentils. Unsalted nuts and seeds. Unsalted canned beans. Dairy Low-fat dairy products, such as skim or 1% milk, 2% or reduced-fat cheeses, low-fat ricotta or cottage cheese, or plain low-fat yogurt. Low-sodium or reduced-sodium cheeses. Fats and Oils Tub margarines without trans fats. Light or reduced-fat mayonnaise and salad dressings (reduced sodium). Avocado. Safflower, olive, or canola oils. Natural peanut or almond butter. Other Unsalted popcorn and pretzels. The items listed above may not be a complete list of recommended foods or beverages. Contact your dietitian for more options. WHAT FOODS ARE NOT RECOMMENDED? Grains White bread. White pasta. White rice. Refined cornbread. Bagels and croissants. Crackers that contain trans fat. Vegetables Creamed or fried vegetables. Vegetables in a cheese sauce. Regular canned vegetables. Regular canned tomato sauce and paste. Regular tomato and vegetable juices. Fruits Dried fruits. Canned fruit in light or heavy syrup. Fruit juice. Meat and Other Protein Products Fatty cuts of meat. Ribs, chicken wings, bacon, sausage, bologna, salami, chitterlings, fatback, hot dogs, bratwurst, and packaged luncheon meats. Salted nuts and seeds. Canned beans with salt. Dairy Whole or 2% milk, cream, half-and-half, and cream cheese. Whole-fat or sweetened yogurt. Full-fat cheeses or blue cheese. Nondairy creamers and whipped toppings. Processed cheese, cheese spreads, or cheese curds. Condiments Onion and garlic salt, seasoned salt, table salt, and sea salt. Canned and packaged gravies. Worcestershire sauce.  Tartar sauce. Barbecue sauce. Teriyaki sauce. Soy sauce, including reduced sodium. Steak sauce. Fish sauce. Oyster sauce. Cocktail sauce. Horseradish. Ketchup and mustard. Meat flavorings and tenderizers. Bouillon cubes. Hot sauce. Tabasco sauce. Marinades. Taco seasonings. Relishes. Fats and Oils Butter, stick margarine, lard, shortening, ghee, and bacon fat. Coconut, palm kernel, or palm oils. Regular salad dressings. Other Pickles and olives. Salted popcorn and pretzels. The items listed above may not be a complete list of foods and beverages to avoid. Contact your dietitian for more information. WHERE CAN I FIND MORE INFORMATION? National Heart, Lung, and Blood Institute: CablePromo.itwww.nhlbi.nih.gov/health/health-topics/topics/dash/ Document Released: 07/05/2011 Document Revised: 11/30/2013 Document Reviewed: 05/20/2013 [facility] Patient Information 2015 [place], [place]. This information is not intended to replace advice given to you by your health care provider. Make sure you discuss any questions you have with your health care provider.

## 2018-12-11 ENCOUNTER — Other Ambulatory Visit: Payer: Self-pay | Admitting: Adult Health

## 2018-12-11 DIAGNOSIS — I10 Essential (primary) hypertension: Secondary | ICD-10-CM

## 2018-12-11 LAB — COMPLETE METABOLIC PANEL WITH GFR
AG Ratio: 1.8 (calc) (ref 1.0–2.5)
ALT: 56 U/L — ABNORMAL HIGH (ref 9–46)
AST: 46 U/L — ABNORMAL HIGH (ref 10–40)
Albumin: 4.8 g/dL (ref 3.6–5.1)
Alkaline phosphatase (APISO): 69 U/L (ref 36–130)
BUN: 13 mg/dL (ref 7–25)
CO2: 28 mmol/L (ref 20–32)
Calcium: 10 mg/dL (ref 8.6–10.3)
Chloride: 98 mmol/L (ref 98–110)
Creat: 0.96 mg/dL (ref 0.60–1.35)
GFR, Est African American: 111 mL/min/{1.73_m2} (ref >=60)
GFR, Est Non African American: 96 mL/min/{1.73_m2} (ref >=60)
Globulin: 2.7 g/dL (calc) (ref 1.9–3.7)
Glucose, Bld: 98 mg/dL (ref 65–99)
Potassium: 4.4 mmol/L (ref 3.5–5.3)
Sodium: 135 mmol/L (ref 135–146)
Total Bilirubin: 0.7 mg/dL (ref 0.2–1.2)
Total Protein: 7.5 g/dL (ref 6.1–8.1)

## 2018-12-11 LAB — CBC WITH DIFFERENTIAL/PLATELET
Absolute Monocytes: 925 cells/uL (ref 200–950)
Basophils Absolute: 82 cells/uL (ref 0–200)
Basophils Relative: 1.2 %
Eosinophils Absolute: 68 cells/uL (ref 15–500)
Eosinophils Relative: 1 %
HCT: 42.3 % (ref 38.5–50.0)
Hemoglobin: 14.6 g/dL (ref 13.2–17.1)
Lymphs Abs: 857 cells/uL (ref 850–3900)
MCH: 32.2 pg (ref 27.0–33.0)
MCHC: 34.5 g/dL (ref 32.0–36.0)
MCV: 93.2 fL (ref 80.0–100.0)
MPV: 9.9 fL (ref 7.5–12.5)
Monocytes Relative: 13.6 %
Neutro Abs: 4869 cells/uL (ref 1500–7800)
Neutrophils Relative %: 71.6 %
Platelets: 290 10*3/uL (ref 140–400)
RBC: 4.54 10*6/uL (ref 4.20–5.80)
RDW: 12.6 % (ref 11.0–15.0)
Total Lymphocyte: 12.6 %
WBC: 6.8 10*3/uL (ref 3.8–10.8)

## 2018-12-11 LAB — LIPID PANEL
Cholesterol: 212 mg/dL — ABNORMAL HIGH (ref <200)
HDL: 77 mg/dL (ref >=40)
LDL Cholesterol (Calc): 119 mg/dL (calc) — ABNORMAL HIGH
Non-HDL Cholesterol (Calc): 135 mg/dL (calc) — ABNORMAL HIGH (ref <130)
Total CHOL/HDL Ratio: 2.8 (calc) (ref <5.0)
Triglycerides: 72 mg/dL (ref <150)

## 2018-12-11 LAB — TSH: TSH: 3.63 mIU/L (ref 0.40–4.50)

## 2018-12-11 LAB — MAGNESIUM: Magnesium: 1.8 mg/dL (ref 1.5–2.5)

## 2018-12-11 MED ORDER — ROSUVASTATIN CALCIUM 40 MG PO TABS: ORAL_TABLET | ORAL | 1 refills | Status: DC

## 2018-12-30 ENCOUNTER — Encounter: Payer: Self-pay | Admitting: Physician Assistant

## 2018-12-30 ENCOUNTER — Other Ambulatory Visit: Payer: Self-pay

## 2018-12-30 ENCOUNTER — Ambulatory Visit (INDEPENDENT_AMBULATORY_CARE_PROVIDER_SITE_OTHER): Payer: BLUE CROSS/BLUE SHIELD | Admitting: Physician Assistant

## 2018-12-30 VITALS — BP 132/86 | HR 57 | Temp 98.1°F | Wt 275.4 lb

## 2018-12-30 DIAGNOSIS — R197 Diarrhea, unspecified: Secondary | ICD-10-CM

## 2018-12-30 MED ORDER — METRONIDAZOLE 500 MG PO TABS: 500.0000 mg | ORAL_TABLET | Freq: Three times a day (TID) | ORAL | 0 refills | Status: AC

## 2018-12-30 NOTE — Progress Notes (Signed)
Subjective:    Patient ID: Cameron Donovan, male    DOB: 25-Sep-1975, 43 y.o.   MRN: [id number]  HPI 43 y.o. WM presents with Ab pain, diarrhea x 1 week.  States Tuesday after memorial day, he went to work, had vomiting x 2 and then has had diarrhea since that time. Tuesday night last week he had a fever. He had testing COVID 19 that was negative. He continues to have AB pain and diarrhea 2-3 times an hour.   Small volume, very watery, with Ab cramping, worse in the AM, with urgency, every hour- 2 hours he will need to go. States it smells like "death". No fever or chills since last week.  Lives by himself. No one sick around him.   He did not eat anything odd memorial day weekend, cooked steaks for himself.  He had abscess tooth 3 weeks ago, Augmentin.  Patient denies illness in household contacts, recent camping, recent travel, unintentional weight loss.  Patient denies daycare exposure, exposure to illness, hospitalization, unusual food and untreated water. Last Colonoscopy: Never colonoscopy.   BMI is Body mass index is 32.66 kg/m., He has lost weight unintentionally, no night sweats.  Wt Readings from Last 3 Encounters:  12/30/18 275 lb 6.4 oz (124.9 kg)  12/10/18 283 lb 9.6 oz (128.6 kg)  08/27/18 289 lb 9.6 oz (131.4 kg)    Blood pressure 132/86, pulse (!) 57, temperature 98.1 F (36.7 C), weight 275 lb 6.4 oz (124.9 kg), SpO2 97 %.  Medications Current Outpatient Medications on File Prior to Visit  Medication Sig  . bisoprolol-hydrochlorothiazide (ZIAC) 10-6.25 MG tablet Take 1 tablet Daily for BP - Must have Office Visit before any further refills  . Cholecalciferol 10000 units CAPS Take 1 capsule by mouth daily.  Marland Kitchen ezetimibe (ZETIA) 10 MG tablet Take 1 tablet (10 mg total) by mouth daily.  Marland Kitchen gabapentin (NEURONTIN) 300 MG capsule Take 1 capsule 3 to 4 x /day if needed for Pain  . ibuprofen (ADVIL) 800 MG tablet Take 800 mg by mouth every 8 (eight) hours as needed.  .  meloxicam (MOBIC) 15 MG tablet Take one daily with food for 2 weeks, can take with tylenol, can not take with aleve, iburpofen, then as needed daily for pain  . minoxidil (LONITEN) 10 MG tablet Take 1 tablet daily for BP to prevent Heart Attack, Stroke & Kidney Failure  . rosuvastatin (CRESTOR) 40 MG tablet Take 1 tablet daily for Cholesterol  . sildenafil (VIAGRA) 100 MG tablet Take 1 tablet (100 mg total) by mouth daily as needed for erectile dysfunction. Take 1/2 to 1 tablet daily if needed for XXXX  . lisinopril (PRINIVIL,ZESTRIL) 40 MG tablet Take 1 tablet at night  for BP   No current facility-administered medications on file prior to visit.     Problem list He has ADD (attention deficit disorder); Anxiety; Testosterone Deficiency; Vitamin D deficiency; Thyroiditis; Essential hypertension; Hyperlipidemia; Other abnormal glucose; Medication management; Obesity (BMI 30.0-34.9); and Fatty liver on their problem list.   Review of Systems  Constitutional: Positive for fever. Negative for chills and fatigue.  HENT: Negative.   Respiratory: Negative.   Cardiovascular: Negative.   Gastrointestinal: Positive for abdominal pain and diarrhea. Negative for abdominal distention, anal bleeding, blood in stool, constipation, nausea, rectal pain and vomiting.  Genitourinary: Negative.   Musculoskeletal: Negative.   Neurological: Negative.        Objective:   Physical Exam Constitutional:      Appearance: He is  well-developed.  HENT:     Head: Normocephalic and atraumatic.  Neck:     Musculoskeletal: Normal range of motion and neck supple.  Cardiovascular:     Rate and Rhythm: Normal rate and regular rhythm.  Pulmonary:     Effort: Pulmonary effort is normal.     Breath sounds: Normal breath sounds.  Abdominal:     General: Bowel sounds are normal. There is no distension.     Palpations: Abdomen is soft. There is no mass.     Tenderness: There is abdominal tenderness in the left lower  quadrant. There is no guarding or rebound. Negative signs include Murphy's sign, Rovsing's sign, McBurney's sign, psoas sign and obturator sign.  Musculoskeletal: Normal range of motion.  Skin:    General: Skin is warm and dry.  Neurological:     Mental Status: He is alert and oriented to person, place, and time.           Assessment & Plan:  Cameron FearingJames was seen today for emesis, diarrhea, abdominal pain and other.  Diagnoses and all orders for this visit:  Diarrhea of presumed infectious origin -     CBC with Differential/Platelet -     COMPLETE METABOLIC PANEL WITH GFR -     TSH -     Magnesium -     Gastrointestinal Pathogen Panel PCR; Future -     metroNIDAZOLE (FLAGYL) 500 MG tablet; Take 1 tablet (500 mg total) by mouth 3 (three) times daily for 7 days.  Possible Cdiff, diverticulitis, colitis, viral infection, bacterial infection Will get stool samples- can start ABX AFTER he collects stool samples Get on probiotic Will treat with flaygl to cover diverticulitis, etc.  Suggest GI referral however patient declines at this time Call the office or go to the ER if bloody stools, persistent diarrhea, not peeing regularly, unable to take oral fluids, vomiting, high fever, severe weakness, abdominal pain or failure to improve in 3 days.

## 2018-12-30 NOTE — Patient Instructions (Signed)
Call the office or go to the ER if bloody stools, persistent diarrhea, not peeing regularly, unable to take oral fluids, vomiting, high fever, severe weakness, abdominal pain or failure to improve in 3 days.    Viral Gastroenteritis, Adult  Viral gastroenteritis is also known as the stomach flu. This condition is caused by various viruses. These viruses can be passed from person to person very easily (are very contagious). This condition may affect your stomach, small intestine, and large intestine. It can cause sudden watery diarrhea, fever, and vomiting. Diarrhea and vomiting can make you feel weak and cause you to become dehydrated. You may not be able to keep fluids down. Dehydration can make you tired and thirsty, cause you to have a dry mouth, and decrease how often you urinate. Older adults and people with other diseases or a weak immune system are at higher risk for dehydration. It is important to replace the fluids that you lose from diarrhea and vomiting. If you become severely dehydrated, you may need to get fluids through an IV tube. What are the causes? Gastroenteritis is caused by various viruses, including rotavirus and norovirus. Norovirus is the most common cause in adults. You can get sick by eating food, drinking water, or touching a surface contaminated with one of these viruses. You can also get sick from sharing utensils or other personal items with an infected person. What increases the risk? This condition is more likely to develop in people:  Who have a weak defense system (immune system).  Who live with one or more children who are younger than 667 years old.  Who live in a nursing home.  Who go on cruise ships. What are the signs or symptoms? Symptoms of this condition start suddenly 1-2 days after exposure to a virus. Symptoms may last a few days or as long as a week. The most common symptoms are watery diarrhea and vomiting. Other symptoms include:  Fever.   Headache.  Fatigue.  Pain in the abdomen.  Chills.  Weakness.  Nausea.  Muscle aches.  Loss of appetite. How is this diagnosed? This condition is diagnosed with a medical history and physical exam. You may also have a stool test to check for viruses or other infections. How is this treated? This condition typically goes away on its own. The focus of treatment is to restore lost fluids (rehydration). Your health care provider may recommend that you take an oral rehydration solution (ORS) to replace important salts and minerals (electrolytes) in your body. Severe cases of this condition may require giving fluids through an IV tube. Treatment may also include medicine to help with your symptoms. Follow these instructions at home:  Follow instructions from your health care provider about how to care for yourself at home. Follow these recommendations as told by your health care provider:  Take an ORS. This is a drink that is sold at pharmacies and retail stores.  Drink clear fluids in small amounts as you are able. Clear fluids include water, ice chips, diluted fruit juice, and low-calorie sports drinks.  Eat bland, easy-to-digest foods in small amounts as you are able. These foods include bananas, applesauce, rice, lean meats, toast, and crackers.  Avoid fluids that contain a lot of sugar or caffeine, such as energy drinks, sports drinks, and soda.  Avoid alcohol.  Avoid spicy or fatty foods. General instructions  Drink enough fluid to keep your urine clear or pale yellow.  Wash your hands often. If soap and water  are not available, use hand sanitizer.  Make sure that all people in your household wash their hands well and often.  Take over-the-counter and prescription medicines only as told by your health care provider.  Rest at home while you recover.  Watch your condition for any changes.  Take a warm bath to relieve any burning or pain from frequent diarrhea episodes.   Keep all follow-up visits as told by your health care provider. This is important. Contact a health care provider if:  You cannot keep fluids down.  Your symptoms get worse.  You have new symptoms.  You feel light-headed or dizzy.  You have muscle cramps. Get help right away if:  You have chest pain.  You feel extremely weak or you faint.  You see blood in your vomit.  Your vomit looks like coffee grounds.  You have bloody or black stools or stools that look like tar.  You have a severe headache, a stiff neck, or both.  You have a rash.  You have severe pain, cramping, or bloating in your abdomen.  You have trouble breathing or you are breathing very quickly.  Your heart is beating very quickly.  Your skin feels cold and clammy.  You feel confused.  You have pain when you urinate.  You have signs of dehydration, such as: ? Dark urine, very little urine, or no urine. ? Cracked lips. ? Dry mouth. ? Sunken eyes. ? Sleepiness. ? Weakness. This information is not intended to replace advice given to you by your health care provider. Make sure you discuss any questions you have with your health care provider. Document Released: 07/16/2005 Document Revised: 02/28/2017 Document Reviewed: 03/22/2015 Elsevier Interactive Patient Education  2019 [employer]   Clostridium Difficile Infection Clostridium difficile (C. difficile or C. diff) infection is a condition that occurs when an overgrowth of C. diff bacteria causes irritation and swelling (inflammation) of the colon (colitis). This infection can be passed from person to person (can be contagious). You may also be exposed to the bacteria from the environment, such as from food or water or from touching surfaces that have the bacteria on them. What are the causes? Certain bacteria normally live in the colon and help to digest food. This infection develops when the balance of bacteria in the colon is changed and C.  diff grows out of control. This is often caused by taking antibiotics. What increases the risk? This condition is more likely to develop in people who:  Need to take antibiotics for a long period of time.  Take certain antibiotics that kill a wide range of bacteria.  Are in the hospital.  Are older.  Live in a place where there is a lot of contact with others, such as a nursing home.  Have had a C. diff infection before.  Have a weak defense (immune) system.  Take a medicine called proton pump inhibitors over a long period of time (chronic use).  Have serious underlying conditions, such as colon cancer.  Have had gastrointestinal (GI) tract procedure or surgery. What are the signs or symptoms? Symptoms of this condition include:  Diarrhea. This may be bloody, watery, or yellow or green in color.  Fever.  Fatigue.  Loss of appetite.  Nausea.  Swelling, pain, or tenderness in the abdomen. How is this diagnosed? This condition is diagnosed with medical history and physical exam. You may also have other tests, including:  A test that checks for C. diff in your stool.  Blood tests.  Imaging tests, such as a CT scan of your abdomen. In rare cases, a sigmoidoscopy or colonoscopy may be used to look directly into your colon. These procedures involve passing an instrument through your rectum to look at the inside of your colon. How is this treated? Treatment for this condition includes:  Stopping the antibiotics that you were on when the C. diff infection began. Do this only as told by your health care provider.  Taking certain antibiotics to stop C. diff from growing.  Taking donor stool from a healthy person and placing it into the colon (fecal transplant) through a colonoscope or nasogastric tube. This may be done if you have a recurrent infection.  Having surgery to remove the infected part of the colon (rare). Follow these instructions at home: Eating and drinking    Eat bland, easy-to-digest foods in small amounts as you are able. These foods include bananas, applesauce, rice, lean meats, toast, and crackers.  Follow specific instructions on how to get enough water into your body (rehydrate). This may include: ? Drinking clear fluids, such as water, ice chips, diluted fruit juice, and low-calorie sports drinks. ? Taking an oral rehydration solution (ORS). This is a drink that is sold at pharmacies and retail stores.  Avoid milk, caffeine, and alcohol.  Drink enough fluid to keep your urine pale yellow. General instructions  Take over-the-counter and prescription medicines only as told by your health care provider.  Take your antibiotic medicine as told by your health care provider. Do not stop taking the antibiotic, even if you start to feel better. You may stop taking it only if your health care provider tells you to stop.  Wash your hands with soap and water. If soap and water are not available, use hand sanitizer.  Do not use medicines to help with diarrhea.  Keep all follow-up visits as told by your health care provider. This is important. How is this prevented? Hand hygiene   Use soap and water to wash your hands thoroughly before you prepare food and after you use the bathroom. Make sure that people who live with you also wash their hands often.  If you are being treated at a hospital or clinic, make sure that all health care providers wash their hands with soap and water before touching you.  If you are in the hospital, make sure that all visitors wash their hands with soap and water before touching you. Contact precautions  If you develop diarrhea while in the hospital or a long-term care facility, let your health care team know right away.  When visiting someone in the hospital or a long-term care facility, follow guidelines for wearing a gown, gloves, or other protective equipment.  If possible, avoid contact with people who  have diarrhea. Clean environment  Clean surfaces with a product that contains chlorine bleach.  If you are in the hospital, make sure that staff members clean the surfaces in your room daily. Let a staff person know right away if body fluids have splashed or spilled in your room. Contact a health care provider if:  Your symptoms do not get better with treatment.  Your symptoms get worse, even with treatment.  Your symptoms go away and then come back.  You have a fever.  You have new symptoms. Get help right away if:  You have more pain or tenderness in your abdomen.  You have stool that is mostly bloody, or your stool looks dark black and  tarry.  You cannot eat or drink without vomiting.  You have signs or symptoms of dehydration, such as: ? Dark urine, very little urine, or no urine. ? Cracked lips. ? Not making tears when you cry. ? Dry mouth. ? Sunken eyes. ? Sleepiness. ? Weakness. ? Dizziness. Summary  Clostridium difficile (C. difficile or C. diff) infection is a condition that occurs when an overgrowth of C. diff bacteria causes irritation and swelling (inflammation) of the colon.  Symptoms of C. diff infection include diarrhea, fever, fatigue, loss of appetite, nausea, and swelling, pain, or tenderness in the abdomen.  C. diff infection is treated by stopping the antibiotics you were on when the C. diff infection began, and then taking certain antibiotics to keep C. diff from growing. In some cases, fecal transplant or surgery is performed.  Hand washing with soap and water, contact precautions, and a clean environment can help prevent or limit the spread of C. diff infection. This information is not intended to replace advice given to you by your health care provider. Make sure you discuss any questions you have with your health care provider. Document Released: 04/25/2005 Document Revised: 04/24/2017 Document Reviewed: 04/24/2017 Elsevier Interactive Patient  Education  2019 [employer].

## 2018-12-31 ENCOUNTER — Other Ambulatory Visit: Payer: Self-pay | Admitting: *Deleted

## 2018-12-31 DIAGNOSIS — R197 Diarrhea, unspecified: Secondary | ICD-10-CM

## 2018-12-31 LAB — COMPLETE METABOLIC PANEL WITH GFR
AG Ratio: 1.7 (calc) (ref 1.0–2.5)
ALT: 48 U/L — ABNORMAL HIGH (ref 9–46)
AST: 31 U/L (ref 10–40)
Albumin: 4.3 g/dL (ref 3.6–5.1)
Alkaline phosphatase (APISO): 66 U/L (ref 36–130)
BUN/Creatinine Ratio: 6 (calc) (ref 6–22)
BUN: 5 mg/dL — ABNORMAL LOW (ref 7–25)
CO2: 23 mmol/L (ref 20–32)
Calcium: 9.3 mg/dL (ref 8.6–10.3)
Chloride: 99 mmol/L (ref 98–110)
Creat: 0.85 mg/dL (ref 0.60–1.35)
GFR, Est African American: 123 mL/min/{1.73_m2} (ref >=60)
GFR, Est Non African American: 106 mL/min/{1.73_m2} (ref >=60)
Globulin: 2.6 g/dL (calc) (ref 1.9–3.7)
Glucose, Bld: 94 mg/dL (ref 65–99)
Potassium: 4.6 mmol/L (ref 3.5–5.3)
Sodium: 133 mmol/L — ABNORMAL LOW (ref 135–146)
Total Bilirubin: 0.4 mg/dL (ref 0.2–1.2)
Total Protein: 6.9 g/dL (ref 6.1–8.1)

## 2018-12-31 LAB — CBC WITH DIFFERENTIAL/PLATELET
Absolute Monocytes: 1210 cells/uL — ABNORMAL HIGH (ref 200–950)
Basophils Absolute: 218 cells/uL — ABNORMAL HIGH (ref 0–200)
Basophils Relative: 2.4 %
Eosinophils Absolute: 218 cells/uL (ref 15–500)
Eosinophils Relative: 2.4 %
HCT: 44 % (ref 38.5–50.0)
Hemoglobin: 15.2 g/dL (ref 13.2–17.1)
Lymphs Abs: 1756 cells/uL (ref 850–3900)
MCH: 32.2 pg (ref 27.0–33.0)
MCHC: 34.5 g/dL (ref 32.0–36.0)
MCV: 93.2 fL (ref 80.0–100.0)
MPV: 9.7 fL (ref 7.5–12.5)
Monocytes Relative: 13.3 %
Neutro Abs: 5697 cells/uL (ref 1500–7800)
Neutrophils Relative %: 62.6 %
Platelets: 342 10*3/uL (ref 140–400)
RBC: 4.72 10*6/uL (ref 4.20–5.80)
RDW: 12.7 % (ref 11.0–15.0)
Total Lymphocyte: 19.3 %
WBC: 9.1 10*3/uL (ref 3.8–10.8)

## 2018-12-31 LAB — TSH: TSH: 1.54 mIU/L (ref 0.40–4.50)

## 2018-12-31 LAB — MAGNESIUM: Magnesium: 1.6 mg/dL (ref 1.5–2.5)

## 2019-01-06 ENCOUNTER — Telehealth: Payer: Self-pay

## 2019-01-06 NOTE — Telephone Encounter (Signed)
Patient inquiring about lab results. Informed patient that they were not available yet and he will be notified as soon as they become available.  Patient states that he is still taking the antibiotics as prescribed, only having diarrhea about three times a day and the abdominal pain that he had been having has subsided, having it mostly in the morning.

## 2019-01-07 LAB — GASTROINTESTINAL PATHOGEN PANEL PCR
C. difficile Tox A/B, PCR: NOT DETECTED
Campylobacter, PCR: DETECTED — AB
Cryptosporidium, PCR: NOT DETECTED
E coli (ETEC) LT/ST PCR: NOT DETECTED
E coli (STEC) stx1/stx2, PCR: NOT DETECTED
E coli 0157, PCR: NOT DETECTED
Giardia lamblia, PCR: NOT DETECTED
Norovirus, PCR: NOT DETECTED
Rotavirus A, PCR: NOT DETECTED
Salmonella, PCR: NOT DETECTED
Shigella, PCR: NOT DETECTED

## 2019-02-17 ENCOUNTER — Encounter: Payer: Self-pay | Admitting: Internal Medicine

## 2019-03-10 ENCOUNTER — Encounter: Payer: Self-pay | Admitting: Internal Medicine

## 2019-03-22 ENCOUNTER — Encounter: Payer: Self-pay | Admitting: Internal Medicine

## 2019-03-22 NOTE — Patient Instructions (Signed)

## 2019-03-22 NOTE — Progress Notes (Signed)
Annual  Screening/Preventative Visit  & Comprehensive Evaluation & Examination     This very nice 43 y.o.  WWM presents for a Screening /Preventative Visit & comprehensive evaluation and management of multiple medical co-morbidities.  Patient has been followed for HTN, HLD, Prediabetes and Vitamin D Deficiency.     HTN predates since 2007. Patient's BP has nor been monitored at home & its's unclear 2what BP meds that he's been taking .  Today's BP was initially recorded at 222/136 by the nurse and later rechecked at 190/ 96. Patient denies any cardiac symptoms as chest pain, palpitations, shortness of breath, dizziness or ankle swelling.     Patient's hyperlipidemia is not controlled with diet and he has been typically non compliant with eds. . Patient denies myalgias or other medication SE's. Last lipids were not at goal: Lab Results  Component Value Date   CHOL 212 (H) 12/10/2018   HDL 77 12/10/2018   LDLCALC 119 (H) 12/10/2018   TRIG 72 12/10/2018   CHOLHDL 2.8 12/10/2018      Patient has hx/o prediabetes (A1c5.7% / 2012) and patient denies reactive hypoglycemic symptoms, visual blurring, diabetic polys or paresthesias. Last A1c was Normal & at goal: Lab Results  Component Value Date   HGBA1C 5.4 08/27/2018       Finally,  patient has history of Vitamin D Deficiency ("39" / 2012)  and last vitamin D was severely low: Lab Results  Component Value Date   VD25OH 11 (L) 08/27/2018   Current Outpatient Medications on File Prior to Visit  Medication Sig  . Cholecalciferol 10000 units CAPS Take 1 capsule by mouth daily.  Marland Kitchen. ezetimibe (ZETIA) 10 MG tablet Take 1 tablet (10 mg total) by mouth daily.  Marland Kitchen. ibuprofen (ADVIL) 800 MG tablet Take 800 mg by mouth every 8 (eight) hours as needed.  . meloxicam (MOBIC) 15 MG tablet Take one daily with food for 2 weeks, can take with tylenol, can not take with aleve, iburpofen, then as needed daily for pain  . rosuvastatin (CRESTOR) 40 MG tablet  Take 1 tablet daily for Cholesterol  . sildenafil (VIAGRA) 100 MG tablet Take 1 tablet (100 mg total) by mouth daily as needed for erectile dysfunction. Take 1/2 to 1 tablet daily if needed for XXXX  . gabapentin (NEURONTIN) 300 MG capsule Take 1 capsule 3 to 4 x /day if needed for Pain (Patient not taking: Reported on 03/23/2019)   No current facility-administered medications on file prior to visit.    No Known Allergies   Past Medical History:  Diagnosis Date  . ADD (attention deficit disorder)   . Anxiety   . Hypertension   . Hypogonadism male   . Thyroiditis   . Vitamin D deficiency    Health Maintenance  Topic Date Due  . HIV Screening  03/17/1989  . INFLUENZA VACCINE  02/28/2019  . TETANUS/TDAP  09/23/2024   Immunization History  Administered Date(s) Administered  . Influenza Split 04/29/2015  . PPD Test 09/23/2014, 01/21/2018, 03/23/2019  . Tdap 09/23/2014   History reviewed. No pertinent surgical history.   Social History   Socioeconomic History  . Marital status: Married    Spouse name: Divorced  . Number of children: None  . Years of education: Not on file  . Highest education level: Not on file  Occupational History  . welder  Tobacco Use  . Smoking status: Never Smoker  . Smokeless tobacco: Current User    Types: Chew  Substance and Sexual  Activity  . Alcohol use: Yes    Alcohol/week: 6.0 standard drinks    Types: 6 Cans of beer per week  . Drug use: Yes    Frequency: 1.0 times per week    Types: Marijuana  . Sexual activity: Yes    Partners: Female    ROS Constitutional: Denies fever, chills, weight loss/gain, headaches, insomnia,  night sweats or change in appetite. Does c/o fatigue. Eyes: Denies redness, blurred vision, diplopia, discharge, itchy or watery eyes.  ENT: Denies discharge, congestion, post nasal drip, epistaxis, sore throat, earache, hearing loss, dental pain, Tinnitus, Vertigo, Sinus pain or snoring.  Cardio: Denies chest pain,  palpitations, irregular heartbeat, syncope, dyspnea, diaphoresis, orthopnea, PND, claudication or edema Respiratory: denies cough, dyspnea, DOE, pleurisy, hoarseness, laryngitis or wheezing.  Gastrointestinal: Denies dysphagia, heartburn, reflux, water brash, pain, cramps, nausea, vomiting, bloating, diarrhea, constipation, hematemesis, melena, hematochezia, jaundice or hemorrhoids Genitourinary: Denies dysuria, frequency, urgency, nocturia, hesitancy, discharge, hematuria or flank pain Musculoskeletal: Denies arthralgia, myalgia, stiffness, Jt. Swelling, pain, limp or strain/sprain. Denies Falls. Skin: Denies puritis, rash, hives, warts, acne, eczema or change in skin lesion Neuro: No weakness, tremor, incoordination, spasms, paresthesia or pain Psychiatric: Denies confusion, memory loss or sensory loss. Denies Depression. Endocrine: Denies change in weight, skin, hair change, nocturia, and paresthesia, diabetic polys, visual blurring or hyper / hypo glycemic episodes.  Heme/Lymph: No excessive bleeding, bruising or enlarged lymph nodes.  Physical Exam  BP (!) 222/136  - rechecked at 190/ 96  Pulse 60   Temp (!) 97 F (36.1 C)   Resp 18   Ht 6\' 5"  (1.956 m)   Wt 281 lb 12.8 oz (127.8 kg)   BMI 33.42 kg/m   General Appearance:  Over nourished and well groomed and in no apparent distress.  Eyes: PERRLA, EOMs, conjunctiva no swelling or erythema, normal fundi and vessels. Sinuses: No frontal/maxillary tenderness ENT/Mouth: EACs patent / TMs  nl. Nares clear without erythema, swelling, mucoid exudates. Oral hygiene is good. No erythema, swelling, or exudate. Tongue normal, non-obstructing. Tonsils not swollen or erythematous. Hearing normal.  Neck: Supple, thyroid not palpable. No bruits, nodes or JVD. Respiratory: Respiratory effort normal.  BS equal and clear bilateral without rales, rhonci, wheezing or stridor. Cardio: Heart sounds are normal with regular rate and rhythm and no murmurs,  rubs or gallops. Peripheral pulses are normal and equal bilaterally without edema. No aortic or femoral bruits. Chest: symmetric with normal excursions and percussion.  Abdomen: Soft, with Nl bowel sounds. Nontender, no guarding, rebound, hernias, masses, or organomegaly.  Lymphatics: Non tender without lymphadenopathy.  Musculoskeletal: Full ROM all peripheral extremities, joint stability, 5/5 strength, and normal gait. Skin: Warm and dry without rashes, lesions, cyanosis, clubbing or  ecchymosis.  Neuro: Cranial nerves intact, reflexes equal bilaterally. Normal muscle tone, no cerebellar symptoms. Sensation intact.  Pysch: Alert and oriented X 3 with normal affect, insight and judgment appropriate.   Assessment and Plan  1. Annual Preventative/Screening Exam   2. Essential hypertension  - EKG 12-Lead - Urinalysis, Routine w reflex microscopic - Microalbumin / creatinine urine ratio - CBC with Differential/Platelet - COMPLETE METABOLIC PANEL WITH GFR - Magnesium - TSH - bisoprolol-hctz (ZIAC) 10-6.25 MG tablet; Take 1 tablet every Morning for BP  Dispense: 90 tablet; Refill: 1 - minoxidil (LONITEN) 10 MG tablet; Take 1 tablet  2 x /day for BP to prevent Heart Attack, Stroke & Kidney Failure  Dispense: 180 tablet; Refill: 1 - olmesartan (BENICAR) 40 MG tablet; Take 1 tablet  every Night for BP  Dispense: 90 tablet; Refill: 1  - advised monitoring BP , discussed meds & SE's & ROV 10 days to recheck.  3. Hyperlipidemia, mixed  - EKG 12-Lead - Lipid panel - TSH  4. Abnormal glucose  - EKG 12-Lead - Hemoglobin A1c - Insulin, random  5. Vitamin D deficiency  - VITAMIN D 25 Hydroxyl  6. Prediabetes  - EKG 12-Lead - Hemoglobin A1c - Insulin, random  7. Screening for colorectal cancer  - POC Hemoccult Bld/Stl  8. Prostate cancer screening  9. Screening for ischemic heart disease  - EKG 12-Lead  10. Screening examination for pulmonary tuberculosis  - TB Skin Test   11. Fatigue  - Iron,Total/Total Iron Binding Cap - Vitamin B12 - Testosterone - CBC with Differential/Platelet  12. Medication management  - Urinalysis, Routine w reflex microscopic - Microalbumin / creatinine urine ratio - CBC with Differential/Platelet - COMPLETE METABOLIC PANEL WITH GFR - Magnesium - Lipid panel - TSH - Hemoglobin A1c - Insulin, random - VITAMIN D 25 Hydroxy        Patient was counseled in prudent diet, weight control to achieve/maintain BMI less than 25, BP monitoring, regular exercise and medications as discussed.  Discussed med effects and SE's. Routine screening labs and tests as requested with regular follow-up as recommended. Over 40 minutes of exam, counseling, chart review and high complex critical decision making was performed  Kirtland Bouchard, MD

## 2019-03-23 ENCOUNTER — Other Ambulatory Visit: Payer: Self-pay

## 2019-03-23 ENCOUNTER — Ambulatory Visit: Payer: BC Managed Care – PPO | Admitting: Internal Medicine

## 2019-03-23 VITALS — BP 222/136 | HR 60 | Temp 97.0°F | Resp 18 | Ht 77.0 in | Wt 281.8 lb

## 2019-03-23 DIAGNOSIS — Z131 Encounter for screening for diabetes mellitus: Secondary | ICD-10-CM

## 2019-03-23 DIAGNOSIS — E782 Mixed hyperlipidemia: Secondary | ICD-10-CM

## 2019-03-23 DIAGNOSIS — R5383 Other fatigue: Secondary | ICD-10-CM

## 2019-03-23 DIAGNOSIS — Z1389 Encounter for screening for other disorder: Secondary | ICD-10-CM

## 2019-03-23 DIAGNOSIS — Z0001 Encounter for general adult medical examination with abnormal findings: Secondary | ICD-10-CM

## 2019-03-23 DIAGNOSIS — Z79899 Other long term (current) drug therapy: Secondary | ICD-10-CM | POA: Diagnosis not present

## 2019-03-23 DIAGNOSIS — R350 Frequency of micturition: Secondary | ICD-10-CM | POA: Diagnosis not present

## 2019-03-23 DIAGNOSIS — Z125 Encounter for screening for malignant neoplasm of prostate: Secondary | ICD-10-CM | POA: Diagnosis not present

## 2019-03-23 DIAGNOSIS — Z13220 Encounter for screening for lipoid disorders: Secondary | ICD-10-CM | POA: Diagnosis not present

## 2019-03-23 DIAGNOSIS — N401 Enlarged prostate with lower urinary tract symptoms: Secondary | ICD-10-CM | POA: Diagnosis not present

## 2019-03-23 DIAGNOSIS — Z136 Encounter for screening for cardiovascular disorders: Secondary | ICD-10-CM | POA: Diagnosis not present

## 2019-03-23 DIAGNOSIS — R7309 Other abnormal glucose: Secondary | ICD-10-CM

## 2019-03-23 DIAGNOSIS — R7303 Prediabetes: Secondary | ICD-10-CM

## 2019-03-23 DIAGNOSIS — I10 Essential (primary) hypertension: Secondary | ICD-10-CM

## 2019-03-23 DIAGNOSIS — Z1211 Encounter for screening for malignant neoplasm of colon: Secondary | ICD-10-CM

## 2019-03-23 DIAGNOSIS — Z1329 Encounter for screening for other suspected endocrine disorder: Secondary | ICD-10-CM | POA: Diagnosis not present

## 2019-03-23 DIAGNOSIS — Z0000 Encounter for general adult medical examination without abnormal findings: Secondary | ICD-10-CM | POA: Diagnosis not present

## 2019-03-23 DIAGNOSIS — E559 Vitamin D deficiency, unspecified: Secondary | ICD-10-CM | POA: Diagnosis not present

## 2019-03-23 DIAGNOSIS — Z111 Encounter for screening for respiratory tuberculosis: Secondary | ICD-10-CM

## 2019-03-23 DIAGNOSIS — Z130 Encounter for screening for diseases of the blood and blood-forming organs and certain disorders involving the immune mechanism: Secondary | ICD-10-CM | POA: Diagnosis not present

## 2019-03-23 MED ORDER — MINOXIDIL 10 MG PO TABS: ORAL_TABLET | ORAL | 1 refills | Status: DC

## 2019-03-23 MED ORDER — OLMESARTAN MEDOXOMIL 40 MG PO TABS: ORAL_TABLET | ORAL | 1 refills | Status: DC

## 2019-03-23 MED ORDER — BISOPROLOL-HYDROCHLOROTHIAZIDE 10-6.25 MG PO TABS: ORAL_TABLET | ORAL | 1 refills | Status: DC

## 2019-03-24 LAB — LIPID PANEL
Cholesterol: 226 mg/dL — ABNORMAL HIGH (ref <200)
HDL: 78 mg/dL (ref >=40)
LDL Cholesterol (Calc): 129 mg/dL (calc) — ABNORMAL HIGH
Non-HDL Cholesterol (Calc): 148 mg/dL (calc) — ABNORMAL HIGH (ref <130)
Total CHOL/HDL Ratio: 2.9 (calc) (ref <5.0)
Triglycerides: 90 mg/dL (ref <150)

## 2019-03-24 LAB — IRON, TOTAL/TOTAL IRON BINDING CAP
%SAT: 38 % (calc) (ref 20–48)
Iron: 140 ug/dL (ref 50–180)
TIBC: 364 mcg/dL (calc) (ref 250–425)

## 2019-03-24 LAB — INSULIN, RANDOM: Insulin: 6.2 u[IU]/mL

## 2019-03-24 LAB — MAGNESIUM: Magnesium: 1.8 mg/dL (ref 1.5–2.5)

## 2019-03-24 LAB — COMPLETE METABOLIC PANEL WITH GFR
AG Ratio: 1.7 (calc) (ref 1.0–2.5)
ALT: 38 U/L (ref 9–46)
AST: 37 U/L (ref 10–40)
Albumin: 4.9 g/dL (ref 3.6–5.1)
Alkaline phosphatase (APISO): 72 U/L (ref 36–130)
BUN: 12 mg/dL (ref 7–25)
CO2: 25 mmol/L (ref 20–32)
Calcium: 9.7 mg/dL (ref 8.6–10.3)
Chloride: 104 mmol/L (ref 98–110)
Creat: 1 mg/dL (ref 0.60–1.35)
GFR, Est African American: 105 mL/min/{1.73_m2} (ref >=60)
GFR, Est Non African American: 90 mL/min/{1.73_m2} (ref >=60)
Globulin: 2.9 g/dL (calc) (ref 1.9–3.7)
Glucose, Bld: 103 mg/dL — ABNORMAL HIGH (ref 65–99)
Potassium: 4.5 mmol/L (ref 3.5–5.3)
Sodium: 141 mmol/L (ref 135–146)
Total Bilirubin: 0.4 mg/dL (ref 0.2–1.2)
Total Protein: 7.8 g/dL (ref 6.1–8.1)

## 2019-03-24 LAB — VITAMIN D 25 HYDROXY (VIT D DEFICIENCY, FRACTURES): Vit D, 25-Hydroxy: 22 ng/mL — ABNORMAL LOW (ref 30–100)

## 2019-03-24 LAB — CBC WITH DIFFERENTIAL/PLATELET
Absolute Monocytes: 628 cells/uL (ref 200–950)
Basophils Absolute: 122 cells/uL (ref 0–200)
Basophils Relative: 2 %
Eosinophils Absolute: 98 cells/uL (ref 15–500)
Eosinophils Relative: 1.6 %
HCT: 46.2 % (ref 38.5–50.0)
Hemoglobin: 15.6 g/dL (ref 13.2–17.1)
Lymphs Abs: 939 cells/uL (ref 850–3900)
MCH: 32.2 pg (ref 27.0–33.0)
MCHC: 33.8 g/dL (ref 32.0–36.0)
MCV: 95.3 fL (ref 80.0–100.0)
MPV: 9.6 fL (ref 7.5–12.5)
Monocytes Relative: 10.3 %
Neutro Abs: 4313 cells/uL (ref 1500–7800)
Neutrophils Relative %: 70.7 %
Platelets: 301 10*3/uL (ref 140–400)
RBC: 4.85 10*6/uL (ref 4.20–5.80)
RDW: 13.1 % (ref 11.0–15.0)
Total Lymphocyte: 15.4 %
WBC: 6.1 10*3/uL (ref 3.8–10.8)

## 2019-03-24 LAB — URINALYSIS, ROUTINE W REFLEX MICROSCOPIC
Bilirubin Urine: NEGATIVE
Glucose, UA: NEGATIVE
Hgb urine dipstick: NEGATIVE
Ketones, ur: NEGATIVE
Leukocytes,Ua: NEGATIVE
Nitrite: NEGATIVE
Protein, ur: NEGATIVE
Specific Gravity, Urine: 1.015 (ref 1.001–1.03)
pH: 6 (ref 5.0–8.0)

## 2019-03-24 LAB — VITAMIN B12: Vitamin B-12: 350 pg/mL (ref 200–1100)

## 2019-03-24 LAB — HEMOGLOBIN A1C
Hgb A1c MFr Bld: 5.3 % of total Hgb (ref <5.7)
Mean Plasma Glucose: 105 (calc)
eAG (mmol/L): 5.8 (calc)

## 2019-03-24 LAB — MICROALBUMIN / CREATININE URINE RATIO
Creatinine, Urine: 102 mg/dL (ref 20–320)
Microalb Creat Ratio: 33 mcg/mg creat — ABNORMAL HIGH (ref <30)
Microalb, Ur: 3.4 mg/dL

## 2019-03-24 LAB — TESTOSTERONE: Testosterone: 439 ng/dL (ref 250–827)

## 2019-03-24 LAB — TSH: TSH: 1.75 mIU/L (ref 0.40–4.50)

## 2019-04-01 ENCOUNTER — Encounter: Payer: Self-pay | Admitting: *Deleted

## 2019-04-02 ENCOUNTER — Other Ambulatory Visit: Payer: Self-pay

## 2019-04-02 ENCOUNTER — Ambulatory Visit: Payer: BC Managed Care – PPO | Admitting: Internal Medicine

## 2019-04-02 ENCOUNTER — Encounter: Payer: Self-pay | Admitting: Internal Medicine

## 2019-04-02 VITALS — BP 194/120 | HR 80 | Temp 97.2°F | Resp 18 | Ht 77.0 in | Wt 283.2 lb

## 2019-04-02 DIAGNOSIS — I10 Essential (primary) hypertension: Secondary | ICD-10-CM | POA: Diagnosis not present

## 2019-04-02 MED ORDER — NIFEDIPINE ER OSMOTIC RELEASE 60 MG PO TB24: ORAL_TABLET | ORAL | 0 refills | Status: DC

## 2019-04-02 NOTE — Progress Notes (Signed)
      History of Present Illness:    Patient is a nice 43 yo WWM followed with HTN, HLD, Pr34eDM, Vit D Def who was seen recently with accelerated HTN at 190/96 abd was restarted on meds - Ziac-10, Olmesartan 40 mg and Minoxidil 10 mg bid. He denies any sx's of HA's , dizziness , CP, palpitation  Medications  .  bisoprolol-hydrochlorothiazide (ZIAC) 10, Take 1 tablet every Morning  .  ezetimibe (ZETIA) 10 MG tablet, Take 1 tablet (10 mg total) by mouth daily. .  minoxidil (LONITEN) 10 MG tablet, Take 1 tablet  2 x /day for BP .  olmesartan (BENICAR) 40 MG tablet, Take 1 tablet every Night for BP .  rosuvastatin (CRESTOR) 40 MG tablet, Take 1 tablet daily for Cholesterol .  sildenafil (VIAGRA) 100 MG tablet, Take 1 tablet (100 mg total) by mouth daily as needed .  ibuprofen (ADVIL) 800 MG tablet, Take 800 mg by mouth every 8 (eight) hours as needed. .  meloxicam (MOBIC) 15 MG tablet, Take one  daily for pain .  Cholecalciferol 10000 units CAPS, Take 1 capsule by mouth daily. Marland Kitchen  gabapentin  300 MG capsule, Take 1 capsule 3 to 4 x /day if needed for Pain  Problem list He has ADD (attention deficit disorder); Anxiety; Testosterone Deficiency; Vitamin D deficiency; Thyroiditis; Essential hypertension; Hyperlipidemia; Other abnormal glucose; Medication management; Obesity (BMI 30.0-34.9); and Fatty liver on their problem list.   Observations/Objective:  BP ( 194/120   P 80   T 97.2 F  R 18   Ht 6\' 5"   Wt 283 lb 3.2 oz    BMI 33.5   Recheck BP 181/116  HEENT - WNL. Neck - supple.  Chest - Clear equal BS. Cor - Nl HS. RRR w/o sig MGR. PP 1(+). No edema. MS- FROM w/o deformities.  Gait Nl. Neuro -  Nl w/o focal abnormalities.  Assessment and Plan:  1. Accelerated hypertension  - Add NIFEdipine (PROCARDIA XL) 60 MG 24 hr tablet; Take 1 tablet daily  Disp: 30 tablet  - ROV 1 week to recheck  Follow Up Instructions:  - Recommended monitor BP's 2 x/day    I discussed the  assessment and treatment plan with the patient. The patient was provided an opportunity to ask questions and all were answered. The patient agreed with the plan and demonstrated an understanding of the instructions. Between 15-20 minutes of counseling, exam, chart review, and critical decision making was performed. The patient was advised to call back or seek an evaluation if the symptoms worsen or if BP remains elevated.   Kirtland Bouchard, MD

## 2019-04-09 ENCOUNTER — Telehealth: Payer: Self-pay | Admitting: *Deleted

## 2019-04-09 NOTE — Telephone Encounter (Signed)
Patient called and reported the BP medication, Minoxidil, is causing his to shake and he cannot go to work.  He will need a doctor note to return to work. Per Dr Melford Aase, continue Ziac and Procardia in the morning and Benicar in the evening and hold Minoxidil until his appointment on 04/13/2019 with Caryl Pina Corbett,NP. Patient was advised to keep a list of his blood pressures.

## 2019-04-13 ENCOUNTER — Other Ambulatory Visit: Payer: Self-pay

## 2019-04-13 ENCOUNTER — Encounter: Payer: Self-pay | Admitting: Adult Health

## 2019-04-13 ENCOUNTER — Ambulatory Visit (INDEPENDENT_AMBULATORY_CARE_PROVIDER_SITE_OTHER): Payer: BC Managed Care – PPO | Admitting: Adult Health

## 2019-04-13 ENCOUNTER — Ambulatory Visit: Payer: BC Managed Care – PPO | Admitting: Adult Health

## 2019-04-13 VITALS — BP 156/104 | HR 69 | Temp 97.7°F | Wt 277.0 lb

## 2019-04-13 DIAGNOSIS — I10 Essential (primary) hypertension: Secondary | ICD-10-CM

## 2019-04-13 MED ORDER — FUROSEMIDE 40 MG PO TABS: 40.0000 mg | ORAL_TABLET | Freq: Every day | ORAL | 1 refills | Status: DC

## 2019-04-13 NOTE — Progress Notes (Signed)
Assessment and Plan:  Cameron Donovan was seen today for hypertension.  Diagnoses and all orders for this visit:  Resistant hypertension Persistently elevated diastolic; will add diuretic - lasix 40 mg daily, follow up 10-14 days for recheck and BMP/GFR, mag Continue medication: bisoprolol/hctz, minoxidil, olmesartan Continue to hold procardia (SE - dizziness, shakes)  Letter provided to release back to work  Monitor blood pressure at home; call if consistently over 130/80 Reviewed DASH diet.   Will proceed with renal artery ultrasound to r/o stenosis after discussion If persistent and not improving consider referral to cardiology for evaluation No other symptoms suggestive of pheochromocytosis or carcinoid etiology at this time but consider labs in the future if persistently unexplained Go to the ER if any chest pain, shortness of breath, nausea, dizziness, severe HA, changes vision/speech -     furosemide (LASIX) 40 MG tablet; Take 1 tablet (40 mg total) by mouth daily. -     VAS US RENAL ARTERY DUPLEX; Future  Depressed mood Declined medications, wants to work on life first Plans to follow up with Dr. Melford Donovan at follow up and will consider medications then if needed Denies SI/HI Lifestyle discussed: diet/exerise, sleep hygiene, stress management, hydration    Further disposition pending results of labs. Discussed med's effects and SE's.   Over 30 minutes of exam, counseling, chart review, and critical decision making was performed.   Future Appointments  Date Time Provider Cameron Donovan  06/23/2019  3:45 PM Cameron Comber, NP [facility] None  09/24/2019  3:30 PM Cameron Pinto, MD [facility] None  04/06/2020  9:00 AM Cameron Pinto, MD [facility] None    ------------------------------------------------------------------------------------------------------------------   HPI 43 y.o.male presents for close follow up on resistant hypertension; has been following with Dr. Melford Donovan  for medication titration.   Patient called and reported the BP medication, Minoxidil, is causing his to shake and he cannot go to work.  He will need a doctor note to return to work. Per Dr Cameron Donovan, continue Ziac and Procardia in the morning and Benicar in the evening and hold Minoxidil until his appointment on 04/13/2019 with Cameron Pina Rage Beever,NP. Patient was advised to keep a list of his blood pressures.  He reports he does have a cuff but broke, needs to replace; he reports is currently ziac 10-6.25 mg 1 tab in the morning, minoxidil 10 mg BID, benicar 40 mg at night for BP.   He stopped taking procardia 60 mg starting Friday, felt much better Saturday, no more dizziness, "swimmy headed" and shaking hands.   He admits not checking BPs at home, cuff broke last week and hasn't replaced yet, today their BP is BP: (!) 156/104  He does not workout, works physically intense job. He denies chest pain, shortness of breath, dizziness, LE edema, HA, vision changes, PND.   He denies flushing, atypical sweating, abd cramping, diarrhea, anxiety.   BMI is Body mass index is 32.85 kg/Donovan. Wt Readings from Last 3 Encounters:  04/13/19 277 lb (125.6 kg)  04/02/19 283 lb 3.2 oz (128.5 kg)  03/23/19 281 lb 12.8 oz (127.8 kg)      Lab Results  Component Value Date   NA 141 03/23/2019   K 4.5 03/23/2019   CL 104 03/23/2019   CO2 25 03/23/2019   GLUCOSE 103 (H) 03/23/2019   BUN 12 03/23/2019   CREATININE 1.00 03/23/2019   CALCIUM 9.7 03/23/2019   GFRAA 105 03/23/2019   GFRNONAA 90 03/23/2019   Lab Results  Component Value Date   TSH 1.75  03/23/2019    He also reports persistently depressed mood following 4 family members passing away over the last few years; he is not currently on medications, wants to hold off for now, planning to sell the homes they were living in and feels he will do better once this is done. Denies SI/HI. He will follow up with Dr. Judie PetitM.    Past Medical History:  Diagnosis Date   . ADD (attention deficit disorder)   . Anxiety   . Hypertension   . Hypogonadism male   . Thyroiditis   . Vitamin D deficiency      No Known Allergies  Current Outpatient Medications on File Prior to Visit  Medication Sig  . bisoprolol-hydrochlorothiazide (ZIAC) 10-6.25 MG tablet Take 1 tablet every Morning for BP  . Cholecalciferol 10000 units CAPS Take 1 capsule by mouth daily.  Marland Kitchen. ezetimibe (ZETIA) 10 MG tablet Take 1 tablet (10 mg total) by mouth daily.  Marland Kitchen. ibuprofen (ADVIL) 800 MG tablet Take 800 mg by mouth every 8 (eight) hours as needed.  . minoxidil (LONITEN) 10 MG tablet Take 1 tablet  2 x /day for BP to prevent Heart Attack, Stroke & Kidney Failure  . olmesartan (BENICAR) 40 MG tablet Take 1 tablet every Night for BP  . rosuvastatin (CRESTOR) 40 MG tablet Take 1 tablet daily for Cholesterol  . sildenafil (VIAGRA) 100 MG tablet Take 1 tablet (100 mg total) by mouth daily as needed for erectile dysfunction. Take 1/2 to 1 tablet daily if needed for XXXX   No current facility-administered medications on file prior to visit.     ROS: all negative except above.   Physical Exam:  BP (!) 156/104   Pulse 69   Temp 97.7 F (36.5 C)   Wt 277 lb (125.6 kg)   SpO2 97%   BMI 32.85 kg/Donovan   General Appearance: Well nourished, in no apparent distress. Eyes: PERRLA, EOMs, conjunctiva no swelling or erythema Sinuses: No Frontal/maxillary tenderness ENT/Mouth: Ext aud canals clear, TMs without erythema, bulging. No erythema, swelling, or exudate on post pharynx.  Tonsils not swollen or erythematous. Hearing normal.  Neck: Supple, thyroid normal.  Respiratory: Respiratory effort normal, BS equal bilaterally without rales, rhonchi, wheezing or stridor.  Cardio: RRR with no MRGs. Brisk peripheral pulses without edema.  Abdomen: Soft, + BS.  Non tender, no guarding, rebound, hernias, masses. Lymphatics: Non tender without lymphadenopathy.  Musculoskeletal: Full ROM, 5/5 strength,  normal gait.  Skin: Warm, dry without rashes, lesions, ecchymosis.  Neuro: Cranial nerves intact. Normal muscle tone, no cerebellar symptoms. Sensation intact.  Psych: Awake and oriented X 3, depressed affect, Insight and Judgment appropriate.     Dan MakerAshley C Sorayah Schrodt, NP 11:08 AM [facility]

## 2019-04-13 NOTE — Patient Instructions (Addendum)
Goals    . Blood Pressure < 140/90       Start checking blood pressure 2-3 times a day and keep a log  Please start taking lasix (furosemide) 40 mg daily in the morning  Call if having any dizziness or side effects - will reduce minoxidil     HYPERTENSION INFORMATION  Monitor your blood pressure at home, please keep a record and bring that in with you to your next office visit.   Go to the ER if any CP, SOB, nausea, dizziness, severe HA, changes vision/speech  Testing/Procedures: HOW TO TAKE YOUR BLOOD PRESSURE:  Rest 5 minutes before taking your blood pressure.  Don't smoke or drink caffeinated beverages for at least 30 minutes before.  Take your blood pressure before (not after) you eat.  Sit comfortably with your back supported and both feet on the floor (don't cross your legs).  Elevate your arm to heart level on a table or a desk.  Use the proper sized cuff. It should fit smoothly and snugly around your bare upper arm. There should be enough room to slip a fingertip under the cuff. The bottom edge of the cuff should be 1 inch above the crease of the elbow.  Due to a recent study, SPRINT, we have changed our goal for the systolic or top blood pressure number. Ideally we want your top number at 120.  In the [facility] LPRNT Trial, 5000 people were randomized to a goal BP of 120 and 5000 people were randomized to a goal BP of less than 140. The patients with the goal BP at 120 had LESS DEMENTIA, LESS HEART ATTACKS, AND LESS STROKES, AS WELL AS OVERALL DECREASED MORTALITY OR DEATH RATE.   There was another study that showed taking your blood pressure medications at night decrease cardiovascular events.  However if you are on a fluid pill, please take this in the morning.   If you are willing, our goal BP is the top number of 120.  Your most recent BP: BP: (!) 156/104   Take your medications faithfully as instructed. Maintain a healthy weight. Get at least 150 minutes of aerobic  exercise per week. Minimize salt intake. Minimize alcohol intake  DASH Eating Plan DASH stands for "Dietary Approaches to Stop Hypertension." The DASH eating plan is a healthy eating plan that has been shown to reduce high blood pressure (hypertension). Additional health benefits may include reducing the risk of type 2 diabetes mellitus, heart disease, and stroke. The DASH eating plan may also help with weight loss. WHAT DO I NEED TO KNOW ABOUT THE DASH EATING PLAN? For the DASH eating plan, you will follow these general guidelines:  Choose foods with a percent daily value for sodium of less than 5% (as listed on the food label).  Use salt-free seasonings or herbs instead of table salt or sea salt.  Check with your health care provider or pharmacist before using salt substitutes.  Eat lower-sodium products, often labeled as "lower sodium" or "no salt added."  Eat fresh foods.  Eat more vegetables, fruits, and low-fat dairy products.  Choose whole grains. Look for the word "whole" as the first word in the ingredient list.  Choose fish and skinless chicken or [place] more often than red meat. Limit fish, poultry, and meat to 6 oz (170 g) each day.  Limit sweets, desserts, sugars, and sugary drinks.  Choose heart-healthy fats.  Limit cheese to 1 oz (28 g) per day.  Eat more home-cooked food and  less restaurant, buffet, and fast food.  Limit fried foods.  Cook foods using methods other than frying.  Limit canned vegetables. If you do use them, rinse them well to decrease the sodium.  When eating at a restaurant, ask that your food be prepared with less salt, or no salt if possible. WHAT FOODS CAN I EAT? Seek help from a dietitian for individual calorie needs. Grains Whole grain or whole wheat bread. Brown rice. Whole grain or whole wheat pasta. Quinoa, bulgur, and whole grain cereals. Low-sodium cereals. Corn or whole wheat flour tortillas. Whole grain cornbread. Whole grain  crackers. Low-sodium crackers. Vegetables Fresh or frozen vegetables (raw, steamed, roasted, or grilled). Low-sodium or reduced-sodium tomato and vegetable juices. Low-sodium or reduced-sodium tomato sauce and paste. Low-sodium or reduced-sodium canned vegetables.  Fruits All fresh, canned (in natural juice), or frozen fruits. Meat and Other Protein Products Ground beef (85% or leaner), grass-fed beef, or beef trimmed of fat. Skinless chicken or [place]. Ground chicken or [place]. Pork trimmed of fat. All fish and seafood. Eggs. Dried beans, peas, or lentils. Unsalted nuts and seeds. Unsalted canned beans. Dairy Low-fat dairy products, such as skim or 1% milk, 2% or reduced-fat cheeses, low-fat ricotta or cottage cheese, or plain low-fat yogurt. Low-sodium or reduced-sodium cheeses. Fats and Oils Tub margarines without trans fats. Light or reduced-fat mayonnaise and salad dressings (reduced sodium). Avocado. Safflower, olive, or canola oils. Natural peanut or almond butter. Other Unsalted popcorn and pretzels. The items listed above may not be a complete list of recommended foods or beverages. Contact your dietitian for more options. WHAT FOODS ARE NOT RECOMMENDED? Grains White bread. White pasta. White rice. Refined cornbread. Bagels and croissants. Crackers that contain trans fat. Vegetables Creamed or fried vegetables. Vegetables in a cheese sauce. Regular canned vegetables. Regular canned tomato sauce and paste. Regular tomato and vegetable juices. Fruits Dried fruits. Canned fruit in light or heavy syrup. Fruit juice. Meat and Other Protein Products Fatty cuts of meat. Ribs, chicken wings, bacon, sausage, bologna, salami, chitterlings, fatback, hot dogs, bratwurst, and packaged luncheon meats. Salted nuts and seeds. Canned beans with salt. Dairy Whole or 2% milk, cream, half-and-half, and cream cheese. Whole-fat or sweetened yogurt. Full-fat cheeses or blue cheese. Nondairy creamers and  whipped toppings. Processed cheese, cheese spreads, or cheese curds. Condiments Onion and garlic salt, seasoned salt, table salt, and sea salt. Canned and packaged gravies. Worcestershire sauce. Tartar sauce. Barbecue sauce. Teriyaki sauce. Soy sauce, including reduced sodium. Steak sauce. Fish sauce. Oyster sauce. Cocktail sauce. Horseradish. Ketchup and mustard. Meat flavorings and tenderizers. Bouillon cubes. Hot sauce. Tabasco sauce. Marinades. Taco seasonings. Relishes. Fats and Oils Butter, stick margarine, lard, shortening, ghee, and bacon fat. Coconut, palm kernel, or palm oils. Regular salad dressings. Other Pickles and olives. Salted popcorn and pretzels. The items listed above may not be a complete list of foods and beverages to avoid. Contact your dietitian for more information. WHERE CAN I FIND MORE INFORMATION? National Heart, Lung, and Blood Institute: travelstabloid.com Document Released: 07/05/2011 Document Revised: 11/30/2013 Document Reviewed: 05/20/2013 [facility] Patient Information 2015 [place], [place]. This information is not intended to replace advice given to you by your health care provider. Make sure you discuss any questions you have with your health care provider.

## 2019-04-15 ENCOUNTER — Other Ambulatory Visit: Payer: Self-pay | Admitting: Adult Health

## 2019-04-15 DIAGNOSIS — I10 Essential (primary) hypertension: Secondary | ICD-10-CM

## 2019-04-16 ENCOUNTER — Ambulatory Visit: Payer: BC Managed Care – PPO | Admitting: Adult Health

## 2019-04-16 ENCOUNTER — Ambulatory Visit: Payer: BC Managed Care – PPO | Admitting: Internal Medicine

## 2019-04-22 NOTE — Progress Notes (Deleted)
Assessment and Plan:  Cameron Donovan was seen today for hypertension.  Diagnoses and all orders for this visit:  Resistant hypertension *** Continue medication: bisoprolol/hctz, minoxidil, olmesartan, lasix 40 mg daily  Check BMP/GFR, mag Continue to hold procardia (SE - dizziness, shakes)  Monitor blood pressure at home; call if consistently over 130/80 Reviewed DASH diet.   Will proceed with renal artery ultrasound to r/o stenosis after discussion If persistent and not improving consider referral to cardiology for evaluation No other symptoms suggestive of pheochromocytosis or carcinoid etiology at this time but consider labs in the future if persistently unexplained Go to the ER if any chest pain, shortness of breath, nausea, dizziness, severe HA, changes vision/speech -     furosemide (LASIX) 40 MG tablet; Take 1 tablet (40 mg total) by mouth daily. -     VAS US RENAL ARTERY DUPLEX; Future  Depressed mood Declined medications, wants to work on life first Plans to follow up with Dr. Oneta Donovan at follow up and will consider medications then if needed Denies SI/HI Lifestyle discussed: diet/exerise, sleep hygiene, stress management, hydration    Further disposition pending results of labs. Discussed med's effects and SE's.   Over 30 minutes of exam, counseling, chart review, and critical decision making was performed.   Future Appointments  Date Time Provider Department Center  04/23/2019  2:30 PM Cameron Gaudier, NP [facility] None  04/24/2019  9:00 AM [facility]  06/23/2019  3:45 PM Cameron Gaudier, NP [facility] None  09/24/2019  3:30 PM Cameron Cowboy, MD [facility] None  04/06/2020  9:00 AM Cameron Cowboy, MD [facility] None    ------------------------------------------------------------------------------------------------------------------   HPI 43 y.o.male presents for 10 day follow up on resistant hypertension;    He is currently on ziac 10-6.25mg ,  olmesartan 40 mg, minoxidil 10 mg BID, recently added lasix 40 mg daily.  He stopped previous nifedipine due to tremors and dizziness with this agent.  He was not previously checking BPs at home but stated he would purchase a cuff to monitor prior to this visit and keep a log ***  ***  Renal artery dopplers were also ordered at the last visit ***  he reports is currently ziac 10-6.25 mg 1 tab in the morning, minoxidil 10 mg BID, benicar 40 mg at night for BP.   He stopped taking procardia 60 mg starting Friday, felt much better Saturday, no more dizziness, "swimmy headed" and shaking hands.   He admits not checking BPs at home, cuff broke last week and hasn't replaced yet, today their BP is    He does not workout, works physically intense job. He denies chest pain, shortness of breath, dizziness, LE edema, HA, vision changes, PND.   He denies flushing, atypical sweating, abd cramping, diarrhea, anxiety.   BMI is There is no height or weight on file to calculate BMI. Wt Readings from Last 3 Encounters:  04/13/19 277 lb (125.6 kg)  04/02/19 283 lb 3.2 oz (128.5 kg)  03/23/19 281 lb 12.8 oz (127.8 kg)      Lab Results  Component Value Date   NA 141 03/23/2019   K 4.5 03/23/2019   CL 104 03/23/2019   CO2 25 03/23/2019   GLUCOSE 103 (H) 03/23/2019   BUN 12 03/23/2019   CREATININE 1.00 03/23/2019   CALCIUM 9.7 03/23/2019   GFRAA 105 03/23/2019   GFRNONAA 90 03/23/2019   Lab Results  Component Value Date   TSH 1.75 03/23/2019    He also reports  persistently depressed mood following 4 family members passing away over the last few years; he is not currently on medications, wants to hold off for now, planning to sell the homes they were living in and feels he will do better once this is done. Denies SI/HI. He will follow up with Cameron Donovan.    Past Medical History:  Diagnosis Date  . ADD (attention deficit disorder)   . Anxiety   . Hypertension   . Hypogonadism male   .  Thyroiditis   . Vitamin D deficiency      No Known Allergies  Current Outpatient Medications on File Prior to Visit  Medication Sig  . bisoprolol-hydrochlorothiazide (ZIAC) 10-6.25 MG tablet Take 1 tablet every Morning for BP  . Cholecalciferol 10000 units CAPS Take 1 capsule by mouth daily.  Marland Kitchen ezetimibe (ZETIA) 10 MG tablet Take 1 tablet (10 mg total) by mouth daily.  . furosemide (LASIX) 40 MG tablet Take 1 tablet (40 mg total) by mouth daily.  Marland Kitchen ibuprofen (ADVIL) 800 MG tablet Take 800 mg by mouth every 8 (eight) hours as needed.  . minoxidil (LONITEN) 10 MG tablet Take 1 tablet  2 x /day for BP to prevent Heart Attack, Stroke & Kidney Failure  . olmesartan (BENICAR) 40 MG tablet Take 1 tablet every Night for BP  . rosuvastatin (CRESTOR) 40 MG tablet Take 1 tablet daily for Cholesterol  . sildenafil (VIAGRA) 100 MG tablet Take 1 tablet (100 mg total) by mouth daily as needed for erectile dysfunction. Take 1/2 to 1 tablet daily if needed for XXXX   No current facility-administered medications on file prior to visit.     ROS: all negative except above.   Physical Exam:  There were no vitals taken for this visit.  General Appearance: Well nourished, in no apparent distress. Eyes: PERRLA, EOMs, conjunctiva no swelling or erythema Sinuses: No Frontal/maxillary tenderness ENT/Mouth: Ext aud canals clear, TMs without erythema, bulging. No erythema, swelling, or exudate on post pharynx.  Tonsils not swollen or erythematous. Hearing normal.  Neck: Supple, thyroid normal.  Respiratory: Respiratory effort normal, BS equal bilaterally without rales, rhonchi, wheezing or stridor.  Cardio: RRR with no MRGs. Brisk peripheral pulses without edema.  Abdomen: Soft, + BS.  Non tender, no guarding, rebound, hernias, masses. Lymphatics: Non tender without lymphadenopathy.  Musculoskeletal: Full ROM, 5/5 strength, normal gait.  Skin: Warm, dry without rashes, lesions, ecchymosis.  Neuro: Cranial  nerves intact. Normal muscle tone, no cerebellar symptoms. Sensation intact.  Psych: Awake and oriented X 3, depressed affect, Insight and Judgment appropriate.     Izora Ribas, NP 1:13 PM [facility]

## 2019-04-23 ENCOUNTER — Encounter: Payer: Self-pay | Admitting: Adult Health Nurse Practitioner

## 2019-04-23 ENCOUNTER — Ambulatory Visit: Payer: BC Managed Care – PPO | Admitting: Adult Health Nurse Practitioner

## 2019-04-23 ENCOUNTER — Ambulatory Visit: Payer: BC Managed Care – PPO | Admitting: Adult Health

## 2019-04-23 ENCOUNTER — Other Ambulatory Visit: Payer: Self-pay

## 2019-04-23 VITALS — Wt 283.0 lb

## 2019-04-23 DIAGNOSIS — I10 Essential (primary) hypertension: Secondary | ICD-10-CM | POA: Diagnosis not present

## 2019-04-23 DIAGNOSIS — F419 Anxiety disorder, unspecified: Secondary | ICD-10-CM | POA: Diagnosis not present

## 2019-04-23 DIAGNOSIS — E669 Obesity, unspecified: Secondary | ICD-10-CM | POA: Diagnosis not present

## 2019-04-23 DIAGNOSIS — F322 Major depressive disorder, single episode, severe without psychotic features: Secondary | ICD-10-CM | POA: Diagnosis not present

## 2019-04-23 DIAGNOSIS — E66811 Obesity, class 1: Secondary | ICD-10-CM

## 2019-04-23 MED ORDER — SERTRALINE HCL 25 MG PO TABS: 25.0000 mg | ORAL_TABLET | Freq: Every day | ORAL | 2 refills | Status: DC

## 2019-04-23 NOTE — Progress Notes (Signed)
Virtual Visit via Telephone Note  I connected with Cameron Donovan on 04/23/19 at 10:40 AM EDT by telephone and verified that I am speaking with the correct person using two identifiers.   I discussed the limitations, risks, security and privacy concerns of performing an evaluation and management service by telephone and the availability of in person appointments. I also discussed with the patient that there may be a patient responsible charge related to this service. The patient expressed understanding and agreed to proceed.   Assessment and Plan:  Cameron Donovan was seen today for other and depression.  Diagnoses and all orders for this visit:  Current severe episode of major depressive disorder without psychotic features, unspecified whether recurrent (HCC) -     sertraline (ZOLOFT) 25 MG tablet; Take 1 tablet (25 mg total) by mouth daily at bedtime. Discussed side effects of medication Do not drink ETHO with medication Discussed importance of counseling services with medication treatment Discussed importance of connection with loved ones and sharing about new medication start.  Essential hypertension Monitor blood pressure has been uncontrolled in past, does not check his blood pressure at home. Prescribed Benicar '40mg'$  & Ziac 10/6.'25mg'$ , minoxidil '10mg'$  and Lasix '40mg'$ . Due for labs next office visit.  Anxiety Discussed resources avialable Discussed stress management techniques  Increase water intake Good sleep hygiene discussed, increase exercise, and increase veggies.   Obesity (BMI 30.0-34.9) Discussed dietary and exercise modifications   Further disposition pending results of labs. Discussed med's effects and SE's.   Over 30 minutes of exam, counseling, chart review, and critical decision making was performed.   Future Appointments  Date Time Provider [place]  04/24/2019  9:00 AM [facility] VASC 3 [facility] [place] [facility]  04/30/2019  3:30 PM [name], NP [facility] None   06/23/2019  3:45 PM [name], NP [facility] None  09/24/2019  3:30 PM [name], MD [facility] None  04/06/2020  9:00 AM [name], MD [facility] None    ------------------------------------------------------------------------------------------------------------------   HPI 43 y.o.male presents for evaluation for depression.  He has lost his mother, father, wife and brother all over the past four years.  He reports he has been progressively getting worse.  He lives in the house where he grew up and reports it has been trying to move on with his life environmentally and mentally.  He has been temporarily let go from his job related to uncontrolled anger.  He has met with his HR department and signed up for a counseling program for outpatient therapy. When his wife passed away he was put on WellbutrinXL and he reports adverse side effects and he reports an increase in anger from this.  Reports this is the first week he has been at home all week.  Reports he just want to sleep all day so he doesn't have to deal with things.  Reports his daughter is a Paramedic at [employer], [place].  He does speak to her frequently.  He also has a girlfriend that lives in [place] and they talk everyday.   He reports he used to enjoy fishing and working outside but he has not had the motivation to do so.  He reports he was drinking almost a 12 pack a day but has cut back on this and reports he has decreased to three drinks.  Reports that he is ready to start the counseling services through his work.  He is wanting to get assistance with his mood "to start taking his life back".  He denies SI/HI  He looks forward to talking to his girlfriend daily.  PHQ9 - 18  Reports he has not been consistent with taking his maintenance medications.  Encouraged taking these daily to decrease harmful long-term effects of this health neglect.  Past Medical History:  Diagnosis Date  . ADD (attention deficit disorder)    . Anxiety   . Hypertension   . Hypogonadism male   . Thyroiditis   . Vitamin D deficiency      No Known Allergies  Current Outpatient Medications on File Prior to Visit  Medication Sig  . bisoprolol-hydrochlorothiazide (ZIAC) 10-6.25 MG tablet Take 1 tablet every Morning for BP  . Cholecalciferol 10000 units CAPS Take 1 capsule by mouth daily.  Marland Kitchen ezetimibe (ZETIA) 10 MG tablet Take 1 tablet (10 mg total) by mouth daily.  . furosemide (LASIX) 40 MG tablet Take 1 tablet (40 mg total) by mouth daily.  Marland Kitchen ibuprofen (ADVIL) 800 MG tablet Take 800 mg by mouth every 8 (eight) hours as needed.  . minoxidil (LONITEN) 10 MG tablet Take 1 tablet  2 x /day for BP to prevent Heart Attack, Stroke & Kidney Failure  . olmesartan (BENICAR) 40 MG tablet Take 1 tablet every Night for BP  . rosuvastatin (CRESTOR) 40 MG tablet Take 1 tablet daily for Cholesterol  . sildenafil (VIAGRA) 100 MG tablet Take 1 tablet (100 mg total) by mouth daily as needed for erectile dysfunction. Take 1/2 to 1 tablet daily if needed for XXXX   No current facility-administered medications on file prior to visit.     ROS: all negative except above.   Physical Exam:  Wt 283 lb (128.4 kg)   BMI 33.56 kg/m   General : Well sounding patient in no apparent distress HEENT: no hoarseness, no cough for duration of visit Lungs: speaks in complete sentences, no audible wheezing, no apparent distress Neurological: alert, oriented x 3 Psychiatric: pleasant, judgement appropriate    Cameron Sierras, NP 11:23 AM [facility]

## 2019-04-23 NOTE — Patient Instructions (Signed)
Sertraline tablets What is this medicine? SERTRALINE (SER tra leen) is used to treat depression. It may also be used to treat obsessive compulsive disorder, panic disorder, post-trauma stress, premenstrual dysphoric disorder (PMDD) or social anxiety. This medicine may be used for other purposes; ask your health care provider or pharmacist if you have questions. COMMON BRAND NAME(S): Zoloft What should I tell my health care provider before I take this medicine? They need to know if you have any of these conditions:  bleeding disorders  bipolar disorder or a family history of bipolar disorder  glaucoma  heart disease  high blood pressure  history of irregular heartbeat  history of low levels of calcium, magnesium, or potassium in the blood  if you often drink alcohol  liver disease  receiving electroconvulsive therapy  seizures  suicidal thoughts, plans, or attempt; a previous suicide attempt by you or a family member  take medicines that treat or prevent blood clots  thyroid disease  an unusual or allergic reaction to sertraline, other medicines, foods, dyes, or preservatives  pregnant or trying to get pregnant  breast-feeding How should I use this medicine? Take this medicine by mouth with a glass of water. Follow the directions on the prescription label. You can take it with or without food. Take your medicine at regular intervals. Do not take your medicine more often than directed. Do not stop taking this medicine suddenly except upon the advice of your doctor. Stopping this medicine too quickly may cause serious side effects or your condition may worsen. A special MedGuide will be given to you by the pharmacist with each prescription and refill. Be sure to read this information carefully each time. Talk to your pediatrician regarding the use of this medicine in children. While this drug may be prescribed for children as young as 7 years for selected conditions,  precautions do apply. Overdosage: If you think you have taken too much of this medicine contact a poison control center or emergency room at once. NOTE: This medicine is only for you. Do not share this medicine with others. What if I miss a dose? If you miss a dose, take it as soon as you can. If it is almost time for your next dose, take only that dose. Do not take double or extra doses. What may interact with this medicine? Do not take this medicine with any of the following medications:  cisapride  dronedarone  linezolid  MAOIs like Carbex, Eldepryl, Marplan, Nardil, and Parnate  methylene blue (injected into a vein)  pimozide  thioridazine This medicine may also interact with the following medications:  alcohol  amphetamines  aspirin and aspirin-like medicines  certain medicines for depression, anxiety, or psychotic disturbances  certain medicines for fungal infections like ketoconazole, fluconazole, posaconazole, and itraconazole  certain medicines for irregular heart beat like flecainide, quinidine, propafenone  certain medicines for migraine headaches like almotriptan, eletriptan, frovatriptan, naratriptan, rizatriptan, sumatriptan, zolmitriptan  certain medicines for sleep  certain medicines for seizures like carbamazepine, valproic acid, phenytoin  certain medicines that treat or prevent blood clots like warfarin, enoxaparin, dalteparin  cimetidine  digoxin  diuretics  fentanyl  isoniazid  lithium  NSAIDs, medicines for pain and inflammation, like ibuprofen or naproxen  other medicines that prolong the QT interval (cause an abnormal heart rhythm) like dofetilide  rasagiline  safinamide  supplements like St. John's wort, kava kava, valerian  tolbutamide  tramadol  tryptophan This list may not describe all possible interactions. Give your health care provider   a list of all the medicines, herbs, non-prescription drugs, or dietary supplements  you use. Also tell them if you smoke, drink alcohol, or use illegal drugs. Some items may interact with your medicine. What should I watch for while using this medicine? Tell your doctor if your symptoms do not get better or if they get worse. Visit your doctor or health care professional for regular checks on your progress. Because it may take several weeks to see the full effects of this medicine, it is important to continue your treatment as prescribed by your doctor. Patients and their families should watch out for new or worsening thoughts of suicide or depression. Also watch out for sudden changes in feelings such as feeling anxious, agitated, panicky, irritable, hostile, aggressive, impulsive, severely restless, overly excited and hyperactive, or not being able to sleep. If this happens, especially at the beginning of treatment or after a change in dose, call your health care professional. You may get drowsy or dizzy. Do not drive, use machinery, or do anything that needs mental alertness until you know how this medicine affects you. Do not stand or sit up quickly, especially if you are an older patient. This reduces the risk of dizzy or fainting spells. Alcohol may interfere with the effect of this medicine. Avoid alcoholic drinks. Your mouth may get dry. Chewing sugarless gum or sucking hard candy, and drinking plenty of water may help. Contact your doctor if the problem does not go away or is severe. What side effects may I notice from receiving this medicine? Side effects that you should report to your doctor or health care professional as soon as possible:  allergic reactions like skin rash, itching or hives, swelling of the face, lips, or tongue  anxious  black, tarry stools  changes in vision  confusion  elevated mood, decreased need for sleep, racing thoughts, impulsive behavior  eye pain  fast, irregular heartbeat  feeling faint or lightheaded, falls  feeling agitated,  angry, or irritable  hallucination, loss of contact with reality  loss of balance or coordination  loss of memory  painful or prolonged erections  restlessness, pacing, inability to keep still  seizures  stiff muscles  suicidal thoughts or other mood changes  trouble sleeping  unusual bleeding or bruising  unusually weak or tired  vomiting Side effects that usually do not require medical attention (report to your doctor or health care professional if they continue or are bothersome):  change in appetite or weight  change in sex drive or performance  diarrhea  increased sweating  indigestion, nausea  tremors This list may not describe all possible side effects. Call your doctor for medical advice about side effects. You may report side effects to FDA at 1-800-FDA-1088. Where should I keep my medicine? Keep out of the reach of children. Store at room temperature between 15 and 30 degrees C (59 and 86 degrees F). Throw away any unused medicine after the expiration date. NOTE: This sheet is a summary. It may not cover all possible information. If you have questions about this medicine, talk to your doctor, pharmacist, or health care provider.  2020 Elsevier/Gold Standard (2018-07-08 10:09:27)  

## 2019-04-24 ENCOUNTER — Ambulatory Visit (HOSPITAL_COMMUNITY)
Admission: RE | Admit: 2019-04-24 | Payer: BC Managed Care – PPO | Source: Ambulatory Visit | Attending: Adult Health | Admitting: Adult Health

## 2019-04-29 NOTE — Progress Notes (Deleted)
Assessment and Plan:  Cameron Donovan was seen today for hypertension.  Diagnoses and all orders for this visit:  Resistant hypertension *** Continue medication: bisoprolol/hctz, minoxidil, olmesartan, lasix 40 mg daily  Check BMP/GFR, mag Continue to hold procardia (SE - dizziness, shakes)  Monitor blood pressure at home; call if consistently over 130/80 Reviewed DASH diet.   Will proceed with renal artery ultrasound to r/o stenosis after discussion If persistent and not improving consider referral to cardiology for evaluation No other symptoms suggestive of pheochromocytosis or carcinoid etiology at this time but consider labs in the future if persistently unexplained Go to the ER if any chest pain, shortness of breath, nausea, dizziness, severe HA, changes vision/speech -     furosemide (LASIX) 40 MG tablet; Take 1 tablet (40 mg total) by mouth daily. -     VAS US RENAL ARTERY DUPLEX; Future -     BMP/GFR -     Magnesium   Depressed mood Newly on zoloft 25 mg daily and starting counseling *** Denies SI/HI Lifestyle discussed: diet/exerise, sleep hygiene, stress management, hydration  Further disposition pending results of labs. Discussed med's effects and SE's.   Over 30 minutes of exam, counseling, chart review, and critical decision making was performed.   Future Appointments  Date Time Provider [place]  04/30/2019  3:30 PM Cameron Comber, NP [facility] None  05/12/2019 10:00 AM [facility] NL VASC 1 [facility]  06/23/2019  3:45 PM Cameron Comber, NP [facility] None  09/24/2019  3:30 PM Cameron Pinto, MD [facility] None  04/06/2020  9:00 AM Cameron Pinto, MD [facility] None    ------------------------------------------------------------------------------------------------------------------   HPI 43 y.o.male presents for 10 day follow up on resistant hypertension; also notably newly on depression medication which was initiated by Cameron Sewer, NP  from our office last week  after he reported being placed on suspension with job due to anger outbursts; he is also pursuing counseling per agreement with work ***  He is currently on ziac 10-6.25mg , olmesartan 40 mg, minoxidil 10 mg BID, recently added lasix 40 mg daily and presents for follow up.  He stopped previous nifedipine due to tremors and dizziness with this agent.  He was not previously checking BPs at home but stated he would purchase a cuff to monitor prior to this visit and keep a log *** Renal artery dopplers were also ordered at the last visit and scheduled for 05/12/2019.  He admits not checking BPs at home, cuff broke last week and hasn't replaced yet, today their BP is    He does not workout, works physically intense job. He denies chest pain, shortness of breath, dizziness, LE edema, HA, vision changes, PND.   He denies flushing, atypical sweating, abd cramping, diarrhea, anxiety.   BMI is There is no height or weight on file to calculate BMI. Wt Readings from Last 3 Encounters:  04/23/19 283 lb (128.4 kg)  04/13/19 277 lb (125.6 kg)  04/02/19 283 lb 3.2 oz (128.5 kg)      Lab Results  Component Value Date   NA 141 03/23/2019   K 4.5 03/23/2019   CL 104 03/23/2019   CO2 25 03/23/2019   GLUCOSE 103 (H) 03/23/2019   BUN 12 03/23/2019   CREATININE 1.00 03/23/2019   CALCIUM 9.7 03/23/2019   GFRAA 105 03/23/2019   GFRNONAA 90 03/23/2019   Lab Results  Component Value Date   TSH 1.75 03/23/2019    He also reports persistently depressed mood following 4 family members passing away over  the last few years; he is not currently on medications, wants to hold off for now, planning to sell the homes they were living in and feels he will do better once this is done. Denies SI/HI. He will follow up with Cameron Donovan.    Past Medical History:  Diagnosis Date  . ADD (attention deficit disorder)   . Anxiety   . Hypertension   . Hypogonadism male   . Thyroiditis   . Vitamin D deficiency      No  Known Allergies  Current Outpatient Medications on File Prior to Visit  Medication Sig  . bisoprolol-hydrochlorothiazide (ZIAC) 10-6.25 MG tablet Take 1 tablet every Morning for BP  . Cholecalciferol 10000 units CAPS Take 1 capsule by mouth daily.  Marland Kitchen ezetimibe (ZETIA) 10 MG tablet Take 1 tablet (10 mg total) by mouth daily.  . furosemide (LASIX) 40 MG tablet Take 1 tablet (40 mg total) by mouth daily.  . minoxidil (LONITEN) 10 MG tablet Take 1 tablet  2 x /day for BP to prevent Heart Attack, Stroke & Kidney Failure  . olmesartan (BENICAR) 40 MG tablet Take 1 tablet every Night for BP  . rosuvastatin (CRESTOR) 40 MG tablet Take 1 tablet daily for Cholesterol  . sertraline (ZOLOFT) 25 MG tablet Take 1 tablet (25 mg total) by mouth daily.  . sildenafil (VIAGRA) 100 MG tablet Take 1 tablet (100 mg total) by mouth daily as needed for erectile dysfunction. Take 1/2 to 1 tablet daily if needed for XXXX   No current facility-administered medications on file prior to visit.     ROS: all negative except above.   Physical Exam:  There were no vitals taken for this visit.  General Appearance: Well nourished, in no apparent distress. Eyes: PERRLA, EOMs, conjunctiva no swelling or erythema Sinuses: No Frontal/maxillary tenderness ENT/Mouth: Ext aud canals clear, TMs without erythema, bulging. No erythema, swelling, or exudate on post pharynx.  Tonsils not swollen or erythematous. Hearing normal.  Neck: Supple, thyroid normal.  Respiratory: Respiratory effort normal, BS equal bilaterally without rales, rhonchi, wheezing or stridor.  Cardio: RRR with no MRGs. Brisk peripheral pulses without edema.  Abdomen: Soft, + BS.  Non tender, no guarding, rebound, hernias, masses. Lymphatics: Non tender without lymphadenopathy.  Musculoskeletal: Full ROM, 5/5 strength, normal gait.  Skin: Warm, dry without rashes, lesions, ecchymosis.  Neuro: Cranial nerves intact. Normal muscle tone, no cerebellar symptoms.  Sensation intact.  Psych: Awake and oriented X 3, depressed affect, Insight and Judgment appropriate.     Cameron Maker, NP 1:31 PM [facility]

## 2019-04-30 ENCOUNTER — Ambulatory Visit: Payer: BC Managed Care – PPO | Admitting: Adult Health

## 2019-05-08 ENCOUNTER — Encounter (HOSPITAL_COMMUNITY): Payer: BC Managed Care – PPO

## 2019-05-12 ENCOUNTER — Ambulatory Visit (HOSPITAL_COMMUNITY)
Admission: RE | Admit: 2019-05-12 | Payer: BC Managed Care – PPO | Source: Ambulatory Visit | Attending: Adult Health | Admitting: Adult Health

## 2019-05-19 ENCOUNTER — Encounter (HOSPITAL_COMMUNITY): Payer: Self-pay

## 2019-05-28 ENCOUNTER — Telehealth: Payer: Self-pay

## 2019-05-28 NOTE — Telephone Encounter (Signed)
PATIENT states that there is no swelling of any kind but this issues has lasted for a MONTH.  He is open to anything that you may suggest, if there is anything that can be done.  Please advise?

## 2019-05-28 NOTE — Telephone Encounter (Signed)
Patient understood to stop taking Procardia. Verified medications with patient. Has an appointment next month already scheduled.

## 2019-05-28 NOTE — Telephone Encounter (Signed)
-----   Message from Vicie Mutters, [place] sent at 05/28/2019  8:14 AM EDT ----- Regarding: RE: med reaction Contact: [phone] Could be normal with it, any swelling with the tongue or lips? Estill Bamberg ----- Message ----- From: Elenor Quinones, CMA Sent: 05/27/2019  12:21 PM EDT To: Vicie Mutters, PA-C Subject: med reaction                                   Since starting new blood pressure meds his tongue goes numb for about 6 hours after taking it in the morning.  Is this normal? Saw Kyra last.  ( [facility]IFEDIPINE ER )

## 2019-05-28 NOTE — Telephone Encounter (Signed)
Talked with Caryl Pina, she discontinue the procardia in Sept due to side effects, so please stop the medication and schedule follow up here in the office.  Review meds with the patient.     Current Outpatient Medications (Cardiovascular):  .  bisoprolol-hydrochlorothiazide (ZIAC) 10-6.25 MG tablet, Take 1 tablet every Morning for BP .  ezetimibe (ZETIA) 10 MG tablet, Take 1 tablet (10 mg total) by mouth daily. .  furosemide (LASIX) 40 MG tablet, Take 1 tablet (40 mg total) by mouth daily. .  minoxidil (LONITEN) 10 MG tablet, Take 1 tablet  2 x /day for BP to prevent Heart Attack, Stroke & Kidney Failure .  olmesartan (BENICAR) 40 MG tablet, Take 1 tablet every Night for BP .  rosuvastatin (CRESTOR) 40 MG tablet, Take 1 tablet daily for Cholesterol .  sildenafil (VIAGRA) 100 MG tablet, Take 1 tablet (100 mg total) by mouth daily as needed for erectile dysfunction. Take 1/2 to 1 tablet daily if needed for XXXX     Current Outpatient Medications (Other):  Marland Kitchen  Cholecalciferol 10000 units CAPS, Take 1 capsule by mouth daily. .  sertraline (ZOLOFT) 25 MG tablet, Take 1 tablet (25 mg total) by mouth daily.

## 2019-06-23 ENCOUNTER — Ambulatory Visit: Payer: BC Managed Care – PPO | Admitting: Adult Health

## 2019-07-13 NOTE — Progress Notes (Deleted)
ACUTE VISIT  Assessment and Plan:  Current severe episode of major depressive disorder without psychotic features, unspecified whether recurrent (HCC) -     sertraline (ZOLOFT) 25 MG tablet; Take 1 tablet (25 mg total) by mouth daily at bedtime. Discussed side effects of medication Do not drink ETHO with medication Discussed importance of counseling services with medication treatment Discussed importance of connection with loved ones and sharing about new medication start.  Essential hypertension Monitor blood pressure has been uncontrolled in past, does not check his blood pressure at home. Prescribed Benicar '40mg'$  & Ziac 10/6.'25mg'$ , minoxidil '10mg'$  and Lasix '40mg'$ . Due for labs next office visit.  Anxiety Discussed resources avialable Discussed stress management techniques  Increase water intake Good sleep hygiene discussed, increase exercise, and increase veggies.   Obesity (BMI 30.0-34.9) Discussed dietary and exercise modifications  Diagnoses and all orders for this visit:  Current severe episode of major depressive disorder without psychotic features, unspecified whether recurrent ([place])  Resistant hypertension  Hyperlipidemia, mixed  Anxiety  Fatigue, unspecified type  Alcohol dependence with unspecified alcohol-induced disorder (HCC)  Obesity (BMI 30.0-34.9)  Abnormal glucose  Vitamin D deficiency  Dizziness  Frequent falls     Further disposition pending results of labs. Discussed med's effects and SE's.   Over 30 minutes of face to face interview, exam, counseling, chart review, and critical decision making was performed.   Future Appointments  Date Time Provider [place]  07/14/2019  9:00 AM Garnet Sierras, NP [facility] None  09/24/2019  3:30 PM [name], MD [facility] None  04/06/2020  9:00 AM [name], MD [facility] None     ------------------------------------------------------------------------------------------------------------------   HPI 43 y.o.male presents for evaluation for   * depression.  He has lost his mother, father, wife and brother all over the past four years.  He reports he has been progressively getting worse.  He lives in the house where he grew up and reports it has been trying to move on with his life environmentally and mentally.  He has been temporarily let go from his job related to uncontrolled anger.  He has met with his HR department and signed up for a counseling program for outpatient therapy. When his wife passed away he was put on WellbutrinXL and he reports adverse side effects and he reports an increase in anger from this.  Reports this is the first week he has been at home all week.  Reports he just want to sleep all day so he doesn't have to deal with things.  Reports his daughter is a Paramedic at [employer], [place].  He does speak to her frequently.  He also has a girlfriend that lives in [place] and they talk everyday.   He reports he used to enjoy fishing and working outside but he has not had the motivation to do so.  He reports he was drinking almost a 12 pack a day but has cut back on this and reports he has decreased to three drinks.  Reports that he is ready to start the counseling services through his work.  He is wanting to get assistance with his mood "to start taking his life back".  He denies SI/HI  He looks forward to talking to his girlfriend daily.  PHQ9 - 18  Reports he has not been consistent with taking his maintenance medications.  Encouraged taking these daily to decrease harmful long-term effects of this health neglect.  Past Medical History:  Diagnosis Date  . ADD (attention deficit disorder)   .  Anxiety   . Hypertension   . Hypogonadism male   . Thyroiditis   . Vitamin D deficiency      No Known Allergies  Current Outpatient Medications on File  Prior to Visit  Medication Sig  . bisoprolol-hydrochlorothiazide (ZIAC) 10-6.25 MG tablet Take 1 tablet every Morning for BP  . Cholecalciferol 10000 units CAPS Take 1 capsule by mouth daily.  Marland Kitchen ezetimibe (ZETIA) 10 MG tablet Take 1 tablet (10 mg total) by mouth daily.  . furosemide (LASIX) 40 MG tablet Take 1 tablet (40 mg total) by mouth daily.  . minoxidil (LONITEN) 10 MG tablet Take 1 tablet  2 x /day for BP to prevent Heart Attack, Stroke & Kidney Failure  . olmesartan (BENICAR) 40 MG tablet Take 1 tablet every Night for BP  . rosuvastatin (CRESTOR) 40 MG tablet Take 1 tablet daily for Cholesterol  . sertraline (ZOLOFT) 25 MG tablet Take 1 tablet (25 mg total) by mouth daily.  . sildenafil (VIAGRA) 100 MG tablet Take 1 tablet (100 mg total) by mouth daily as needed for erectile dysfunction. Take 1/2 to 1 tablet daily if needed for XXXX   No current facility-administered medications on file prior to visit.    ROS: all negative except above.   Physical Exam:  There were no vitals taken for this visit. General Appearance: Well nourished, in no apparent distress. Eyes: PERRLA, EOMs, conjunctiva no swelling or erythema Sinuses: No Frontal/maxillary tenderness ENT/Mouth: Ext aud canals clear, TMs without erythema, bulging. No erythema, swelling, or exudate on post pharynx.  Tonsils not swollen or erythematous. Hearing normal.  Neck: Supple, thyroid normal.  Respiratory: Respiratory effort normal, BS equal bilaterally without rales, rhonchi, wheezing or stridor.  Cardio: RRR with no MRGs. Brisk peripheral pulses without edema.  Abdomen: Soft, + BS.  Non tender, no guarding, rebound, hernias, masses. Lymphatics: Non tender without lymphadenopathy.  Musculoskeletal: Full ROM, 5/5 strength, {PSY - GAIT AND STATION:22860} gait Skin: Warm, dry without rashes, lesions, ecchymosis.  Neuro: Cranial nerves intact. No cerebellar symptoms.  Psych: Awake and oriented X 3, normal affect, Insight and  Judgment appropriate.   Garnet Sierras, NP 10:05 AM [facility]

## 2019-07-14 ENCOUNTER — Ambulatory Visit: Payer: Self-pay | Admitting: Adult Health Nurse Practitioner

## 2019-07-14 DIAGNOSIS — F419 Anxiety disorder, unspecified: Secondary | ICD-10-CM

## 2019-07-14 DIAGNOSIS — F1029 Alcohol dependence with unspecified alcohol-induced disorder: Secondary | ICD-10-CM

## 2019-07-14 DIAGNOSIS — R296 Repeated falls: Secondary | ICD-10-CM

## 2019-07-14 DIAGNOSIS — F322 Major depressive disorder, single episode, severe without psychotic features: Secondary | ICD-10-CM

## 2019-07-14 DIAGNOSIS — E669 Obesity, unspecified: Secondary | ICD-10-CM

## 2019-07-14 DIAGNOSIS — R42 Dizziness and giddiness: Secondary | ICD-10-CM

## 2019-07-14 DIAGNOSIS — R7309 Other abnormal glucose: Secondary | ICD-10-CM

## 2019-07-14 DIAGNOSIS — E559 Vitamin D deficiency, unspecified: Secondary | ICD-10-CM

## 2019-07-14 DIAGNOSIS — R5383 Other fatigue: Secondary | ICD-10-CM

## 2019-07-14 DIAGNOSIS — I10 Essential (primary) hypertension: Secondary | ICD-10-CM

## 2019-07-14 DIAGNOSIS — E782 Mixed hyperlipidemia: Secondary | ICD-10-CM

## 2019-08-04 ENCOUNTER — Telehealth: Payer: Self-pay

## 2019-08-04 NOTE — Telephone Encounter (Signed)
Left voice message in order to ascertain the information that you requested. HOSPITAL FOLLOW UP tomorrow

## 2019-08-05 ENCOUNTER — Encounter: Payer: Self-pay | Admitting: Physician Assistant

## 2019-08-05 ENCOUNTER — Other Ambulatory Visit: Payer: Self-pay

## 2019-08-05 ENCOUNTER — Ambulatory Visit (INDEPENDENT_AMBULATORY_CARE_PROVIDER_SITE_OTHER): Payer: Self-pay | Admitting: Physician Assistant

## 2019-08-05 VITALS — BP 140/100 | HR 97 | Temp 97.6°F | Wt 248.0 lb

## 2019-08-05 DIAGNOSIS — F1020 Alcohol dependence, uncomplicated: Secondary | ICD-10-CM

## 2019-08-05 DIAGNOSIS — E872 Acidosis, unspecified: Secondary | ICD-10-CM

## 2019-08-05 DIAGNOSIS — R7989 Other specified abnormal findings of blood chemistry: Secondary | ICD-10-CM

## 2019-08-05 DIAGNOSIS — I10 Essential (primary) hypertension: Secondary | ICD-10-CM

## 2019-08-05 DIAGNOSIS — R634 Abnormal weight loss: Secondary | ICD-10-CM

## 2019-08-05 MED ORDER — CHLORDIAZEPOXIDE HCL 25 MG PO CAPS: 25.0000 mg | ORAL_CAPSULE | Freq: Two times a day (BID) | ORAL | 0 refills | Status: DC | PRN

## 2019-08-05 MED ORDER — ATENOLOL 50 MG PO TABS: 50.0000 mg | ORAL_TABLET | Freq: Every day | ORAL | 11 refills | Status: DC

## 2019-08-05 NOTE — Progress Notes (Signed)
Hospital follow up  Assessment and Plan: Hospital visit follow up for:   Resistant hypertension -     atenolol (TENORMIN) 50 MG tablet; Take 1 tablet (50 mg total) by mouth daily. CONTINUE OTHER MEDICATIONS, WILL ADD ON ATENOLOL, CLOSE FOLLOW UP 1 WEEK May need to get evaluated for secondary HTN IE OSA, etc- will wait until he gets insurance- will call about his medicaid application   Alcoholism (HCC) -     chlordiazePOXIDE (LIBRIUM) 25 MG capsule; Take 1 capsule (25 mg total) by mouth 2 (two) times daily as needed for withdrawal. - will call an outpatient program, [facility] info [facility] and [facility].  Will do librium low dose for withdrawal and will do small amounts with frequent follow ups Suggest AA No SI/HI STRICT ER PRECAUTIONS FOR DT'S  Elevated liver function tests -     COMPLETE METABOLIC PANEL WITH GFR - will need to be monitored Suggest hep A and B vaccine when able to get  Weight loss Had CT AB/chest at [facility], will monitor No diarrhea, no AB pain, no family history Will monitor closely  Metabolic acidosis -     CBC with Diff -     COMPLETE METABOLIC PANEL WITH GFR   All medications were reviewed with patient and family and fully reconciled. All questions answered fully, and patient and family members were encouraged to call the office with any further questions or concerns. Discussed goal to avoid readmission related to this diagnosis.  There are no discontinued medications.  CAN NOT DO FOR BCBS REGULAR OR MEDICARE Over 40 minutes of exam, counseling, chart review, and complex, high/moderate level critical decision making was performed this visit.   Future Appointments  Date Time Provider Department Center  09/24/2019  3:30 PM [name], MD [facility] None  04/06/2020  9:00 AM [name], MD [facility] None     HPI 44 y.o.male presents for follow up for transition from recent hospitalization or SNIF stay. Admit date to the [facility] was  07/13/2019, patient was discharged from the hospital on 07/16/2019 and our clinical staff contacted the office the day after discharge to set up a follow up appointment. The discharge summary, medications, and diagnostic test results were reviewed before meeting with the patient. The patient was admitted for:  " copied from hospital course:" Briefly, 44 year old male with a history of alcohol use disorder and hypertension who presented to the ED for dizziness and falls. Patient endorsed approximately 7-8 beers daily with extensive periods of low food consumption, sometimes going 4 to 5 days at a time without eating. Patient endorsed an unintentional weight loss of 40 to 50 pounds over the last 3 months as a result of this. He had 3 falls the night prior to admission with associated lightheadedness/dizziness and presented to the ED for further work-up. Labs included lactic acidosis with lactic acid of 3.4, ammonia borderline at 45, elevated transaminases with AST 104 and ALT 88, anion gap 16, sodium 130.   Anion gap metabolic acidosis resolved with fluids, thought to be due to poor PO intact also leading to orthostatic hypotension and syncope.   "CT head and CT C-spine performed in the ED did not show any evidence of fracture or other acute traumatic injury. Patient also had trauma scan of chest/abdomen/pelvis that did not reveal any acute injuries."  Patient endorsed drinking on average 7-8 beers per day prior to admission in the setting of multiple life stressors including the death of his wife 4  years ago and the recent loss of his job. Patient expressed a strong desire during this admission to cease his alcohol use and expressed a clear understanding for the implications regarding his health. He was seen by peer support and provided with both outpatient and inpatient alcohol treatment resources. Patient was managed on CIWA but required minimal p.o. Ativan and was stable at the time of discharge. Patient  was briefly placed on Librium but this was tapered and discontinued prior to discharge. Due to patient's expressed desire for a medication to reduce cravings, he was started on naltrexone 50 mg daily at discharge. Due to likely contributing aspect of depression, his Zoloft was increased from 25 to 50 mg since he endorsed partial efficacy with a 25 mg dose. --Vitamin B12 and TSH both within normal limits.  #Hepatic steatosis --Patient presented with mildly elevated AST and ALT on admission, likely related to EtOH use. Liver ultrasound was performed showing steatosis and anterograde portal vein flow, however with some potential early signs of developing portal hypertension. Patient was educated regarding the importance of alcohol cessation in preventing the progression of his early liver disease. Hepatitis panel was nonreactive, although patient notably not immune to hepatitis A or B. Transaminases trended down prior to discharge but were still slightly above the upper limit of the reference ranges.  Discharge Follow-up Action Items: 1. Follow-up progress of EtOH cessation at PCP follow-up visit. Patient provided with outpatient and inpatient resources prior to discharge. Patient provided with naltrexone at discharge. 2. Recommend rechecking CMP at PCP follow-up visit to ensure stability of transaminases. 3. Continue folate and thiamine supplementation in the setting of alcohol use. 4. Patient not immune to hepatitis A or hepatitis B, consider vaccine series at PCP follow-up. 5. Follow-up blood pressure management at PCP follow-up visit. Currently on HCTZ 25 mg and olmesartan, adjust as needed. 6. Recommend updating routine cancer screening given weight loss, although likely explained by poor p.o. intake and alcohol use rather than occult malignancy.  Since being discharged he is still drinking a little every day, he is drinking 3-4 beers, down from 12 a day. He still wakes up shaking, feels off balance.  He has woken up a few mornings in a cold sweat. No fever, no hallucinations, no palpitations, no anxiety, no diarrhea, stomach upset, no thoughts of suicide or self harm.   BP is still up, he is on HCTZ 25mg , Benicar 40 mg, and minoxidil, he is off ziac at this time.  BP Readings from Last 3 Encounters:  08/05/19 (!) 140/100  04/13/19 (!) 156/104  04/02/19 (!) 194/120   He is drinking water, he will eat one meal a day.   BMI is Body mass index is 29.41 kg/m.  Wt Readings from Last 3 Encounters:  08/05/19 248 lb (112.5 kg)  04/23/19 283 lb (128.4 kg)  04/13/19 277 lb (125.6 kg)    Home health is not involved.   Images while in the hospital: US Abdomen Complete  Result date 07/13/2019 US ABDOMEN/LIVER DOPPLER  Final Result   1. Hepatomegaly with hepatic steatosis.  2. The portal venous system is patent with antegrade flow; however, velocities are low throughout the portal venous system, which can be seen with early portal hypertension.   CT CHEST ABDOMEN PELVIS W CONTRAST (TRAUMA)  Final Result   1. No acute traumatic injury to the chest, abdomen and pelvis.  2. Subcentimeter retroperitoneal lymph nodes with surrounding stranding may reflect a nonspecific adenitis.  3. Hepatomegaly and hepatic steatosis.  4. Please see separately dictated report for detailed findings related to the thoracolumbar spine.    family history is not on file.    Current Outpatient Medications (Cardiovascular):  .  bisoprolol-hydrochlorothiazide (ZIAC) 10-6.25 MG tablet, Take 1 tablet every Morning for BP .  ezetimibe (ZETIA) 10 MG tablet, Take 1 tablet (10 mg total) by mouth daily. .  furosemide (LASIX) 40 MG tablet, Take 1 tablet (40 mg total) by mouth daily. .  hydrochlorothiazide (HYDRODIURIL) 25 MG tablet, Take by mouth. .  minoxidil (LONITEN) 10 MG tablet, Take 1 tablet  2 x /day for BP to prevent Heart Attack, Stroke & Kidney Failure .  olmesartan (BENICAR) 40 MG tablet, Take 1 tablet  every Night for BP .  rosuvastatin (CRESTOR) 40 MG tablet, Take 1 tablet daily for Cholesterol .  sildenafil (VIAGRA) 100 MG tablet, Take 1 tablet (100 mg total) by mouth daily as needed for erectile dysfunction. Take 1/2 to 1 tablet daily if needed for XXXX .  atenolol (TENORMIN) 50 MG tablet, Take 1 tablet (50 mg total) by mouth daily.     Current Outpatient Medications (Other):  Marland Kitchen  Cholecalciferol 10000 units CAPS, Take 1 capsule by mouth daily. .  folic acid-pyridoxine-cyancobalamin (FOLTX) 2.5-25-2 MG TABS tablet, Take by mouth. .  Melatonin 3 MG TABS, Take by mouth. .  naltrexone (DEPADE) 50 MG tablet, Take by mouth. .  sertraline (ZOLOFT) 25 MG tablet, Take 1 tablet (25 mg total) by mouth daily. Marland Kitchen  thiamine 100 MG tablet, Take by mouth. .  chlordiazePOXIDE (LIBRIUM) 25 MG capsule, Take 1 capsule (25 mg total) by mouth 2 (two) times daily as needed for withdrawal.  Past Medical History:  Diagnosis Date  . ADD (attention deficit disorder)   . Anxiety   . Hypertension   . Hypogonadism male   . Thyroiditis   . Vitamin D deficiency      No Known Allergies  ROS: all negative except above.   Physical Exam: Filed Weights   08/05/19 1006  Weight: 248 lb (112.5 kg)   BP (!) 140/100   Pulse 97   Temp 97.6 F (36.4 C)   Wt 248 lb (112.5 kg)   SpO2 97%   BMI 29.41 kg/m  General Appearance: Well nourished, in no apparent distress. Eyes: PERRLA, EOMs, conjunctiva no swelling or erythema Sinuses: No Frontal/maxillary tenderness ENT/Mouth: Ext aud canals clear, TMs without erythema, bulging. No erythema, swelling, or exudate on post pharynx.  Tonsils not swollen or erythematous. Hearing normal.  Neck: Supple, thyroid normal.  Respiratory: Respiratory effort normal, BS equal bilaterally without rales, rhonchi, wheezing or stridor.  Cardio: RRR with no MRGs. Brisk peripheral pulses without edema.  Abdomen: Soft, + BS.  Non tender, no guarding, rebound, hernias,  masses. Lymphatics: Non tender without lymphadenopathy.  Musculoskeletal: Full ROM, 5/5 strength, normal gait.  Skin: Warm, dry without rashes, lesions, ecchymosis.  Neuro: Cranial nerves intact. Normal muscle tone, no cerebellar symptoms. Sensation intact.  Psych: Awake and oriented X 3, normal affect, Insight and Judgment appropriate.     Quentin Mulling, PA-C 12:38 PM [facility]

## 2019-08-05 NOTE — Patient Instructions (Addendum)
Check on your medicaid applications by calling social services Call an outpatient alcohol help program   Will need to get hep A and B vaccine eventually  Follow up 1 week here- no charge for next visit  Continue all your BP meds and add on atenolol     Behavioral Health Resources in the [facility]  Intensive Outpatient Programs:  [facility]  [address]  [place], [place]  [phone]  Both a day and evening program  [facility] Out[facility]  [address]  [place], [place] [address]  [phone]  [facility]  [address]  [place]  [phone]  [facility]  ACCESS LINE: [phone] or [phone]  [address]  [place], [place] [address]  [email or web address]  Substance Abuse Resources:  [facility] [phone]  [facility] [phone]  [facility] [phone]  Floydene Flock [phone]  Residential & Outpatient Substance Abuse Program [phone] Psychological Services:  [facility] [phone]  [facility] [phone]  [place], [place]. [address] [place], ACCESS LINE: [phone] or [phone], [email or web address] Mobile Crisis Teams:  [facility]  Mobile Crisis Care Unit  [phone]  Assertive  Psychotherapeutic Services  [address] [place]in[place] Otto  [phone]  Interventionist  [address] [place]  [address]  [place]  [phone]  Self-Help/Support Groups:  [facility] [employer] of support groups  6407952537 (call for more info)  Narcotics Anonymous (NA)  [facility]  [address]  [place] - 2 meetings at this location  Residential Treatment Programs:  [facility]  [address]  [place]  [phone]  [facility]  [address], [place] [address]  [place], [place]  [address]  [phone]  [facility]  [address] [place], [place] [address]  [phone]  Admissions: 8am-3pm M-F  [facility]  [address]  [place], [place] [address]  [phone]  [facility]  [address] [place]  [place], [place]  [phone]  [facility]  [address]  [place], [place]  [phone]  [facility] - Intensive Outpatient  [address]  [place], [place]  [phone]  [facility] ([facility]  [address]  [place], [place]  [phone] or [phone]  Residential Treatment Services ([facility]S), Medicaid  [address]  [place], [place]  [phone]  [facility] [address]  [address]  [place]  [phone]  [facility] Resources:  [phone]  General Therapy  Angie Fava, PhD  [address] [place], [place] [address]  [phone]  Insurance  [facility] Behavioral  [address]  [place], [place] [address]  [phone]  [facility]  [address] [place], [place] [address]  [phone]  Insurance/Medicaid/sponsorship through [facility] and Families  [address]  [place], [place] [address]  Therapy/tele-psych/case  [phone]  [facility]  [address], [place] [address]  Adolescent/group home/case management  [phone]  Creola Corn PhD  General therapy  Insurance  [phone]  Dr. Lolly Mustache, [place], M-F  [phone]  [facility] Dept.  [address] [place] [address]  Blondell Reveal  Phone: [phone] Phone: [phone] Phone: [phone]  [facility], [phone]  [facility] -  [facility]- [phone] - [facility] in [place], [address]  [place]  [phone] or [phone] (After Hours)   Alcohol Withdrawal Syndrome Alcohol withdrawal syndrome is a group of symptoms that can develop when a person who drinks heavily and regularly stops drinking or drinks less. Alcohol withdrawal syndrome can be mild or severe, and it may even be life-threatening. Alcohol withdrawal syndrome usually affects people who have alcohol use disorder, which may also be called alcoholism. Alcohol use disorder is when a person is unable to control his or her alcohol use, and drinking too much or too often causes problems at home, at work, or in relationships. What are the causes? Drinking heavily and drinking on a regular basis cause changes in brain chemistry. Over time, the body becomes dependent on alcohol. When alcohol use stops, the chemistry system in the brain becomes unbalanced and causes the symptoms of alcohol withdrawal. What increases the risk? Alcohol withdrawal syndrome is more likely to occur in people who drink more than the recommended limit of alcohol (2 drinks a day for men or 1 drink a day for non-pregnant women). It is also more likely to affect heavy drinkers who have been using alcohol for long periods of time. The more a person drinks and the longer he or she drinks, the greater the risk of alcohol withdrawal syndrome. Severe withdrawal is more likely to develop in someone who:  Had severe alcohol withdrawal in the past.  Had a seizure during a previous episode of alcohol withdrawal.  Is elderly.  Uses other drugs.  Has a long-term (chronic) medical problem, such as heart, lung, or liver disease.  Has depression.  Does not get enough nutrients from his or her diet (malnutrition). What are the signs or symptoms? Symptoms of this condition can be mild to moderate, or they can be severe. Symptoms may develop a few hours (or up to a day) after a person changes his or her drinking patterns.  During the 48 hours after he or she has stopped drinking, the following symptoms may go away or get better:  Uncontrollable shaking (tremor).  Sweating.  Headache.  Anxiety.  Inability to relax (agitation).  Trouble sleeping (insomnia).  Irregular heartbeats (palpitations).  Alcohol cravings.  Seizure. The following symptoms may get worse 24-48 hours after a person has decreased or stopped alcohol use, and they may gradually improve over a period of days or weeks:  Nausea and vomiting.  Fatigue.  Sensitivity to light and sounds.  Confusion and inability to think clearly.  Loss of appetite.  Mood swings, irritability, depression, and anxiety.  Insomnia and nightmares. The following symptoms are severe and life-threatening. When these symptoms occur together, they are called delirium tremens (DTs):  High blood pressure.  Increased heart rate.  Trouble breathing.  Seizures. These may go away along with other symptoms, or they may persist.  Seeing, hearing, feeling, smelling, or tasting things that are not there (hallucinations). If you experience hallucinations, they usually begin 12-24 hours after a change in drinking patterns. Delirium tremens requires immediate hospitalization. How is this diagnosed? This condition may be diagnosed based on:  Your symptoms and medical history.  Your history of alcohol use. Your health care provider may ask questions about your drinking behavior. It is important to be honest when you answer these questions.  A psychological assessment.  A physical exam.  Blood tests or urine tests to measure blood  alcohol level and to rule out other causes of symptoms.  MRI or CT scan. This may be done if you seem to have abnormal thinking or behaviors (altered mental status). Diagnosis can be difficult. People going through withdrawal often avoid seeking medical care and are not thinking clearly. Friends and family members play an important  role in recognizing symptoms and encouraging loved ones to get treatment. How is this treated? Most people with symptoms of withdrawal can be treated outside of a hospital setting (outpatient treatment), with close monitoring such as daily check-ins with a health care provider and counseling. You may need treatment at a hospital or treatment center (inpatient treatment) if:  You have a history of delirium tremens or seizures.  You have severe symptoms.  You are addicted to other drugs.  You cannot swallow medicine.  You have a serious medical condition such as heart failure.  You experienced withdrawal in the past but then you continued drinking alcohol.  You are not likely to commit to an outpatient treatment schedule. Treatment may involve:  Monitoring your blood pressure, pulse, and breathing.  IV fluids to keep you hydrated.  Medicines to reduce withdrawal symptoms and discomfort (benzodiazepines).  Medicine to reduce anxiety.  Medicine to prevent or control seizures.  Multivitamins and B vitamins.  Having a health care provider check on you daily. It is important to get treatment for alcohol withdrawal early. Getting treatment early can:  Speed up your recovery from withdrawal symptoms.  Make you more successful with long-term stoppage of alcohol use (sobriety). If you need help to stop drinking, your health care provider may recommend a long-term treatment plan that includes:  Medicines to help treat alcohol use disorder.  Substance abuse counseling.  Support groups. Follow these instructions at home:   Take over-the-counter and prescription medicines (including vitamin supplements) only as told by your health care provider.  Do not drink alcohol.  Do not drive until your health care provider approves.  Have someone you trust stay with you or be available if you need help with your symptoms or with not drinking.  Drink enough fluid to keep your urine pale  yellow.  Consider joining an alcohol support group or treatment program. These can provide emotional support, advice, and guidance.  Keep all follow-up visits as told by your health care provider. This is important. Contact a health care provider if:  Your symptoms get worse instead of better.  You cannot eat or drink without vomiting.  You are struggling with not drinking alcohol.  You cannot stop drinking alcohol. Get help right away if:  You have an irregular heartbeat.  You have chest pain.  You have trouble breathing.  You have a seizure for the first time.  You hallucinate.  You become very confused. Summary  Alcohol withdrawal is a group of symptoms that can develop when a person who drinks heavily and regularly stops drinking or drinks less.  Symptoms of this condition can be mild to moderate, or they can be severe.  Treatment may include hospitalization, medicine, and counseling. This information is not intended to replace advice given to you by your health care provider. Make sure you discuss any questions you have with your health care provider. Document Revised: 06/28/2017 Document Reviewed: 03/22/2017 Elsevier Patient Education  2020 [employer].

## 2019-08-06 LAB — CBC WITH DIFFERENTIAL/PLATELET
Absolute Monocytes: 623 cells/uL (ref 200–950)
Basophils Absolute: 90 cells/uL (ref 0–200)
Basophils Relative: 1.2 %
Eosinophils Absolute: 38 cells/uL (ref 15–500)
Eosinophils Relative: 0.5 %
HCT: 41.9 % (ref 38.5–50.0)
Hemoglobin: 14.7 g/dL (ref 13.2–17.1)
Lymphs Abs: 735 cells/uL — ABNORMAL LOW (ref 850–3900)
MCH: 33.6 pg — ABNORMAL HIGH (ref 27.0–33.0)
MCHC: 35.1 g/dL (ref 32.0–36.0)
MCV: 95.9 fL (ref 80.0–100.0)
MPV: 9.9 fL (ref 7.5–12.5)
Monocytes Relative: 8.3 %
Neutro Abs: 6015 cells/uL (ref 1500–7800)
Neutrophils Relative %: 80.2 %
Platelets: 202 10*3/uL (ref 140–400)
RBC: 4.37 10*6/uL (ref 4.20–5.80)
RDW: 13.7 % (ref 11.0–15.0)
Total Lymphocyte: 9.8 %
WBC: 7.5 10*3/uL (ref 3.8–10.8)

## 2019-08-06 LAB — COMPLETE METABOLIC PANEL WITH GFR
AG Ratio: 1.7 (calc) (ref 1.0–2.5)
ALT: 40 U/L (ref 9–46)
AST: 43 U/L — ABNORMAL HIGH (ref 10–40)
Albumin: 4.7 g/dL (ref 3.6–5.1)
Alkaline phosphatase (APISO): 81 U/L (ref 36–130)
BUN: 8 mg/dL (ref 7–25)
CO2: 25 mmol/L (ref 20–32)
Calcium: 9.6 mg/dL (ref 8.6–10.3)
Chloride: 98 mmol/L (ref 98–110)
Creat: 0.84 mg/dL (ref 0.60–1.35)
GFR, Est African American: 123 mL/min/{1.73_m2} (ref >=60)
GFR, Est Non African American: 106 mL/min/{1.73_m2} (ref >=60)
Globulin: 2.7 g/dL (calc) (ref 1.9–3.7)
Glucose, Bld: 103 mg/dL — ABNORMAL HIGH (ref 65–99)
Potassium: 4.5 mmol/L (ref 3.5–5.3)
Sodium: 137 mmol/L (ref 135–146)
Total Bilirubin: 0.6 mg/dL (ref 0.2–1.2)
Total Protein: 7.4 g/dL (ref 6.1–8.1)

## 2019-09-07 ENCOUNTER — Other Ambulatory Visit: Payer: Self-pay | Admitting: Internal Medicine

## 2019-09-07 ENCOUNTER — Other Ambulatory Visit: Payer: Self-pay | Admitting: *Deleted

## 2019-09-14 NOTE — Progress Notes (Deleted)
  Assessment and Plan:    HPI 44 y.o.male with history of alcoholism, depression, obesity, fatty liver, resistant HTN, chol presents from follow up.   He was given librium for possible withdrawal/DT's last visit with strict ER precautions and close follow up.  He was also given atenolol for BP BP Readings from Last 3 Encounters:  08/05/19 (!) 140/100  04/13/19 (!) 156/104  04/02/19 (!) 194/120     Patient Active Problem List   Diagnosis Date Noted  . Fatty liver 05/02/2018  . Obesity (BMI 30.0-34.9) 04/23/2018  . Hyperlipidemia 12/01/2013  . Other abnormal glucose 12/01/2013  . Medication management 12/01/2013  . Anxiety   . Testosterone Deficiency   . Vitamin D deficiency   . Thyroiditis   . Essential hypertension   . ADD (attention deficit disorder)       Current Outpatient Medications (Cardiovascular):  .  atenolol (TENORMIN) 50 MG tablet, Take 1 tablet (50 mg total) by mouth daily. .  bisoprolol-hydrochlorothiazide (ZIAC) 10-6.25 MG tablet, Take 1 tablet every Morning for BP .  ezetimibe (ZETIA) 10 MG tablet, Take 1 tablet (10 mg total) by mouth daily. .  furosemide (LASIX) 40 MG tablet, Take 1 tablet (40 mg total) by mouth daily. .  hydrochlorothiazide (HYDRODIURIL) 25 MG tablet, Take by mouth. .  minoxidil (LONITEN) 10 MG tablet, Take 1 tablet  2 x /day for BP to prevent Heart Attack, Stroke & Kidney Failure .  olmesartan (BENICAR) 40 MG tablet, Take 1 tablet every Night for BP .  rosuvastatin (CRESTOR) 40 MG tablet, Take 1 tablet daily for Cholesterol .  sildenafil (VIAGRA) 100 MG tablet, Take 1 tablet (100 mg total) by mouth daily as needed for erectile dysfunction. Take 1/2 to 1 tablet daily if needed for XXXX     Current Outpatient Medications (Other):  .  chlordiazePOXIDE (LIBRIUM) 25 MG capsule, Take 1 capsule (25 mg total) by mouth 2 (two) times daily as needed for withdrawal. .  Cholecalciferol 10000 units CAPS, Take 1 capsule by mouth daily. .  folic  acid-pyridoxine-cyancobalamin (FOLTX) 2.5-25-2 MG TABS tablet, Take by mouth. .  Melatonin 3 MG TABS, Take by mouth. .  naltrexone (DEPADE) 50 MG tablet, Take by mouth. .  sertraline (ZOLOFT) 25 MG tablet, Take 1 tablet (25 mg total) by mouth daily. Marland Kitchen  thiamine 100 MG tablet, Take by mouth.  No Known Allergies  ROS: all negative except above.   Physical Exam: There were no vitals filed for this visit. There were no vitals taken for this visit. General Appearance: Well nourished, in no apparent distress. Eyes: PERRLA, EOMs, conjunctiva no swelling or erythema Sinuses: No Frontal/maxillary tenderness ENT/Mouth: Ext aud canals clear, TMs without erythema, bulging. No erythema, swelling, or exudate on post pharynx.  Tonsils not swollen or erythematous. Hearing normal.  Neck: Supple, thyroid normal.  Respiratory: Respiratory effort normal, BS equal bilaterally without rales, rhonchi, wheezing or stridor.  Cardio: RRR with no MRGs. Brisk peripheral pulses without edema.  Abdomen: Soft, + BS.  Non tender, no guarding, rebound, hernias, masses. Lymphatics: Non tender without lymphadenopathy.  Musculoskeletal: Full ROM, 5/5 strength, normal gait.  Skin: Warm, dry without rashes, lesions, ecchymosis.  Neuro: Cranial nerves intact. Normal muscle tone, no cerebellar symptoms. Sensation intact.  Psych: Awake and oriented X 3, normal affect, Insight and Judgment appropriate.       Cameron Mulling, PA-C 2:15 PM [facility]

## 2019-09-15 ENCOUNTER — Ambulatory Visit: Payer: Self-pay | Admitting: Physician Assistant

## 2019-09-16 ENCOUNTER — Ambulatory Visit: Payer: Self-pay | Admitting: Physician Assistant

## 2019-09-24 ENCOUNTER — Ambulatory Visit: Payer: BC Managed Care – PPO | Admitting: Internal Medicine

## 2019-11-19 ENCOUNTER — Telehealth: Payer: Self-pay | Admitting: *Deleted

## 2019-11-19 NOTE — Telephone Encounter (Signed)
Patient called and reported he has fractured his foot and is having swelling.  Per Dr [name], try Advil or Aleve to help the swelling.

## 2020-01-20 ENCOUNTER — Other Ambulatory Visit: Payer: Self-pay | Admitting: Internal Medicine

## 2020-02-01 ENCOUNTER — Encounter: Payer: Self-pay | Admitting: Internal Medicine

## 2020-02-01 NOTE — Progress Notes (Signed)
     C  A  N  C  E  L  L  E  D   At     App't      Time                                                                                                                                                                                  This very nice 44 y.o.  WWM presents for  follow up with hx/po severe labile HTN, HLD, PreDM, Alcoholic Liver Disease, and Vit D Deficiency.       Patient was hospitalized at  [facility] in Dec 2020 for 4 days with Orthostatic Hypotension, Dehydration, Acute & Chronic Alcoholism and was detox'ed with Librium taper and discharged on Naltrexone       Patient is treated for HTN since 2007  & BP has been labile with variable control consequent of poor med compliance. Today's  . Patient has had no complaints of any cardiac type chest pain, palpitations, dyspnea / orthopnea / PND, dizziness, claudication, or dependent edema.      Hyperlipidemia at last check was not controlled with diet & off meds.  Last Lipids were not at goal :  Lab Results  Component Value Date   CHOL 226 (H) 03/23/2019   HDL 78 03/23/2019   LDLCALC 129 (H) 03/23/2019   TRIG 90 03/23/2019   CHOLHDL 2.9 03/23/2019    Also, the patient has history of PreDiabetes  (A1c5.7% / 2012)  and has had no symptoms of reactive hypoglycemia, diabetic polys, paresthesias or visual blurring.  Last A1c was   Lab Results  Component Value Date   HGBA1C 5.3 03/23/2019       Further, the patient also has history of Vitamin D Deficiency ("39" / 2012) and  Does not supplement vitamin D as repeatedly advised.  Last vitamin D was not at goal :  Lab Results  Component Value Date   VD25OH 22 (L) 03/23/2019

## 2020-02-02 ENCOUNTER — Ambulatory Visit: Payer: Self-pay | Admitting: Internal Medicine

## 2020-04-04 ENCOUNTER — Encounter: Payer: Self-pay | Admitting: Internal Medicine

## 2020-04-04 NOTE — Progress Notes (Signed)
     3rd       N  O         S  H  O  W  f  o  r Last  3   Visits

## 2020-04-06 ENCOUNTER — Ambulatory Visit: Payer: BC Managed Care – PPO | Admitting: Internal Medicine

## 2020-04-12 ENCOUNTER — Encounter: Payer: BC Managed Care – PPO | Admitting: Internal Medicine

## 2020-11-19 ENCOUNTER — Encounter (HOSPITAL_BASED_OUTPATIENT_CLINIC_OR_DEPARTMENT_OTHER): Payer: Self-pay | Admitting: Emergency Medicine

## 2020-11-19 ENCOUNTER — Emergency Department (HOSPITAL_BASED_OUTPATIENT_CLINIC_OR_DEPARTMENT_OTHER): Payer: Self-pay

## 2020-11-19 ENCOUNTER — Other Ambulatory Visit: Payer: Self-pay

## 2020-11-19 ENCOUNTER — Inpatient Hospital Stay (HOSPITAL_BASED_OUTPATIENT_CLINIC_OR_DEPARTMENT_OTHER)
Admission: EM | Admit: 2020-11-19 | Discharge: 2020-11-23 | DRG: 897 | Disposition: A | Discharge status: Home / Self Care | Payer: Self-pay | Attending: Internal Medicine | Admitting: Internal Medicine

## 2020-11-19 DIAGNOSIS — Z79899 Other long term (current) drug therapy: Secondary | ICD-10-CM

## 2020-11-19 DIAGNOSIS — F10931 Alcohol use, unspecified with withdrawal delirium: Secondary | ICD-10-CM

## 2020-11-19 DIAGNOSIS — E876 Hypokalemia: Secondary | ICD-10-CM | POA: Diagnosis present

## 2020-11-19 DIAGNOSIS — Z781 Physical restraint status: Secondary | ICD-10-CM

## 2020-11-19 DIAGNOSIS — E785 Hyperlipidemia, unspecified: Secondary | ICD-10-CM | POA: Diagnosis present

## 2020-11-19 DIAGNOSIS — E782 Mixed hyperlipidemia: Secondary | ICD-10-CM | POA: Diagnosis present

## 2020-11-19 DIAGNOSIS — F10239 Alcohol dependence with withdrawal, unspecified: Secondary | ICD-10-CM | POA: Diagnosis present

## 2020-11-19 DIAGNOSIS — F17220 Nicotine dependence, chewing tobacco, uncomplicated: Secondary | ICD-10-CM | POA: Diagnosis present

## 2020-11-19 DIAGNOSIS — F10939 Alcohol use, unspecified with withdrawal, unspecified: Secondary | ICD-10-CM | POA: Diagnosis present

## 2020-11-19 DIAGNOSIS — Z20822 Contact with and (suspected) exposure to covid-19: Secondary | ICD-10-CM | POA: Diagnosis present

## 2020-11-19 DIAGNOSIS — F988 Other specified behavioral and emotional disorders with onset usually occurring in childhood and adolescence: Secondary | ICD-10-CM | POA: Diagnosis present

## 2020-11-19 DIAGNOSIS — F419 Anxiety disorder, unspecified: Secondary | ICD-10-CM | POA: Diagnosis present

## 2020-11-19 DIAGNOSIS — E663 Overweight: Secondary | ICD-10-CM | POA: Diagnosis present

## 2020-11-19 DIAGNOSIS — D539 Nutritional anemia, unspecified: Secondary | ICD-10-CM | POA: Diagnosis present

## 2020-11-19 DIAGNOSIS — I10 Essential (primary) hypertension: Secondary | ICD-10-CM | POA: Diagnosis present

## 2020-11-19 DIAGNOSIS — F10231 Alcohol dependence with withdrawal delirium: Principal | ICD-10-CM | POA: Diagnosis present

## 2020-11-19 DIAGNOSIS — Z6829 Body mass index (BMI) 29.0-29.9, adult: Secondary | ICD-10-CM

## 2020-11-19 DIAGNOSIS — K760 Fatty (change of) liver, not elsewhere classified: Secondary | ICD-10-CM | POA: Diagnosis present

## 2020-11-19 DIAGNOSIS — E559 Vitamin D deficiency, unspecified: Secondary | ICD-10-CM | POA: Diagnosis present

## 2020-11-19 LAB — ETHANOL: Alcohol, Ethyl (B): <10 mg/dL (ref <10)

## 2020-11-19 LAB — COMPREHENSIVE METABOLIC PANEL
ALT: 45 U/L — ABNORMAL HIGH (ref 0–44)
ALT: 44 U/L (ref 0–44)
AST: 47 U/L — ABNORMAL HIGH (ref 15–41)
AST: 45 U/L — ABNORMAL HIGH (ref 15–41)
Albumin: 4 g/dL (ref 3.5–5.0)
Albumin: 4.2 g/dL (ref 3.5–5.0)
Alkaline Phosphatase: 38 U/L (ref 38–126)
Alkaline Phosphatase: 38 U/L (ref 38–126)
Anion gap: 11 (ref 5–15)
Anion gap: 9 (ref 5–15)
BUN: 5 mg/dL — ABNORMAL LOW (ref 6–20)
BUN: <5 mg/dL — ABNORMAL LOW (ref 6–20)
CO2: 24 mmol/L (ref 22–32)
CO2: 26 mmol/L (ref 22–32)
Calcium: 9.1 mg/dL (ref 8.9–10.3)
Calcium: 9.3 mg/dL (ref 8.9–10.3)
Chloride: 98 mmol/L (ref 98–111)
Chloride: 106 mmol/L (ref 98–111)
Creatinine, Ser: 0.84 mg/dL (ref 0.61–1.24)
Creatinine, Ser: 0.83 mg/dL (ref 0.61–1.24)
GFR, Estimated: >60 mL/min (ref >60)
GFR, Estimated: >60 mL/min (ref >60)
Glucose, Bld: 119 mg/dL — ABNORMAL HIGH (ref 70–99)
Glucose, Bld: 86 mg/dL (ref 70–99)
Potassium: 3.1 mmol/L — ABNORMAL LOW (ref 3.5–5.1)
Potassium: 3.8 mmol/L (ref 3.5–5.1)
Sodium: 133 mmol/L — ABNORMAL LOW (ref 135–145)
Sodium: 141 mmol/L (ref 135–145)
Total Bilirubin: 0.6 mg/dL (ref 0.3–1.2)
Total Bilirubin: 1.2 mg/dL (ref 0.3–1.2)
Total Protein: 7.3 g/dL (ref 6.5–8.1)
Total Protein: 7.5 g/dL (ref 6.5–8.1)

## 2020-11-19 LAB — CBC WITH DIFFERENTIAL/PLATELET
Abs Immature Granulocytes: 0.04 10*3/uL (ref 0.00–0.07)
Basophils Absolute: 0.1 10*3/uL (ref 0.0–0.1)
Basophils Relative: 2 %
Eosinophils Absolute: 0 10*3/uL (ref 0.0–0.5)
Eosinophils Relative: 0 %
HCT: 36.4 % — ABNORMAL LOW (ref 39.0–52.0)
Hemoglobin: 12.4 g/dL — ABNORMAL LOW (ref 13.0–17.0)
Immature Granulocytes: 1 %
Lymphocytes Relative: 10 %
Lymphs Abs: 0.6 10*3/uL — ABNORMAL LOW (ref 0.7–4.0)
MCH: 33.8 pg (ref 26.0–34.0)
MCHC: 34.1 g/dL (ref 30.0–36.0)
MCV: 99.2 fL (ref 80.0–100.0)
Monocytes Absolute: 0.8 10*3/uL (ref 0.1–1.0)
Monocytes Relative: 15 %
Neutro Abs: 3.9 10*3/uL (ref 1.7–7.7)
Neutrophils Relative %: 72 %
Platelets: 176 10*3/uL (ref 150–400)
RBC: 3.67 MIL/uL — ABNORMAL LOW (ref 4.22–5.81)
RDW: 12.6 % (ref 11.5–15.5)
WBC: 5.5 10*3/uL (ref 4.0–10.5)
nRBC: 0 % (ref 0.0–0.2)

## 2020-11-19 LAB — RAPID URINE DRUG SCREEN, HOSP PERFORMED
Amphetamines: NOT DETECTED
Barbiturates: NOT DETECTED
Benzodiazepines: NOT DETECTED
Cocaine: NOT DETECTED
Opiates: NOT DETECTED
Tetrahydrocannabinol: POSITIVE — AB

## 2020-11-19 LAB — SALICYLATE LEVEL: Salicylate Lvl: <7 mg/dL — ABNORMAL LOW (ref 7.0–30.0)

## 2020-11-19 LAB — CBC
HCT: 38.4 % — ABNORMAL LOW (ref 39.0–52.0)
Hemoglobin: 12.8 g/dL — ABNORMAL LOW (ref 13.0–17.0)
MCH: 34 pg (ref 26.0–34.0)
MCHC: 33.3 g/dL (ref 30.0–36.0)
MCV: 102.1 fL — ABNORMAL HIGH (ref 80.0–100.0)
Platelets: 160 10*3/uL (ref 150–400)
RBC: 3.76 MIL/uL — ABNORMAL LOW (ref 4.22–5.81)
RDW: 12.8 % (ref 11.5–15.5)
WBC: 6 10*3/uL (ref 4.0–10.5)
nRBC: 0 % (ref 0.0–0.2)

## 2020-11-19 LAB — RESP PANEL BY RT-PCR (FLU A&B, COVID) ARPGX2
Influenza A by PCR: NEGATIVE
Influenza B by PCR: NEGATIVE
SARS Coronavirus 2 by RT PCR: NEGATIVE

## 2020-11-19 LAB — MAGNESIUM: Magnesium: 1.6 mg/dL — ABNORMAL LOW (ref 1.7–2.4)

## 2020-11-19 LAB — ACETAMINOPHEN LEVEL: Acetaminophen (Tylenol), Serum: <10 ug/mL — ABNORMAL LOW (ref 10–30)

## 2020-11-19 LAB — PHOSPHORUS: Phosphorus: 3.9 mg/dL (ref 2.5–4.6)

## 2020-11-19 MED ORDER — FOLIC ACID 1 MG PO TABS
1.0000 mg | ORAL_TABLET | Freq: Every day | ORAL | Status: DC
  Administered 2020-11-20 – 2020-11-23 (×4): 1 mg via ORAL
  Filled 2020-11-19 (×4): qty 1

## 2020-11-19 MED ORDER — HALOPERIDOL LACTATE 5 MG/ML IJ SOLN
5.0000 mg | Freq: Once | INTRAMUSCULAR | Status: AC
  Administered 2020-11-19: 5 mg via INTRAVENOUS
  Filled 2020-11-19: qty 1

## 2020-11-19 MED ORDER — LORAZEPAM 2 MG/ML IJ SOLN
2.0000 mg | Freq: Once | INTRAMUSCULAR | Status: AC
  Administered 2020-11-19: 2 mg via INTRAVENOUS
  Filled 2020-11-19: qty 1

## 2020-11-19 MED ORDER — POTASSIUM CHLORIDE CRYS ER 20 MEQ PO TBCR
40.0000 meq | EXTENDED_RELEASE_TABLET | Freq: Once | ORAL | Status: AC
  Administered 2020-11-19: 40 meq via ORAL
  Filled 2020-11-19: qty 2

## 2020-11-19 MED ORDER — ONDANSETRON HCL 4 MG PO TABS: 4.0000 mg | ORAL_TABLET | Freq: Four times a day (QID) | ORAL | Status: DC | PRN

## 2020-11-19 MED ORDER — THIAMINE HCL 100 MG/ML IJ SOLN
Freq: Once | INTRAVENOUS | Status: DC
  Filled 2020-11-19: qty 1000

## 2020-11-19 MED ORDER — LORAZEPAM 2 MG/ML IJ SOLN
1.0000 mg | Freq: Once | INTRAMUSCULAR | Status: AC
  Administered 2020-11-19: 1 mg via INTRAVENOUS
  Filled 2020-11-19: qty 1

## 2020-11-19 MED ORDER — CHLORHEXIDINE GLUCONATE CLOTH 2 % EX PADS
6.0000 | MEDICATED_PAD | Freq: Every day | CUTANEOUS | Status: DC
  Administered 2020-11-19 – 2020-11-21 (×4): 6 via TOPICAL

## 2020-11-19 MED ORDER — MIDAZOLAM HCL 2 MG/2ML IJ SOLN
4.0000 mg | Freq: Once | INTRAMUSCULAR | Status: AC
  Administered 2020-11-19: 4 mg via INTRAVENOUS
  Filled 2020-11-19: qty 4

## 2020-11-19 MED ORDER — ONDANSETRON HCL 4 MG/2ML IJ SOLN: 4.0000 mg | Freq: Four times a day (QID) | INTRAMUSCULAR | Status: DC | PRN

## 2020-11-19 MED ORDER — LORAZEPAM 1 MG PO TABS
1.0000 mg | ORAL_TABLET | ORAL | Status: AC | PRN
  Administered 2020-11-21: 2 mg via ORAL
  Administered 2020-11-21: 1 mg via ORAL
  Administered 2020-11-21: 2 mg via ORAL
  Filled 2020-11-19: qty 2
  Filled 2020-11-19: qty 1
  Filled 2020-11-19: qty 2

## 2020-11-19 MED ORDER — THIAMINE HCL 100 MG/ML IJ SOLN
Freq: Once | INTRAVENOUS | Status: AC
  Filled 2020-11-19: qty 1000

## 2020-11-19 MED ORDER — THIAMINE HCL 100 MG/ML IJ SOLN
100.0000 mg | Freq: Once | INTRAMUSCULAR | Status: AC
  Administered 2020-11-19: 100 mg via INTRAVENOUS
  Filled 2020-11-19: qty 2

## 2020-11-19 MED ORDER — LORAZEPAM 2 MG/ML IJ SOLN
1.0000 mg | INTRAMUSCULAR | Status: AC | PRN
Start: 2020-11-19 — End: 2020-11-22
  Administered 2020-11-20 (×4): 2 mg via INTRAVENOUS
  Administered 2020-11-20: 4 mg via INTRAVENOUS
  Administered 2020-11-20 – 2020-11-21 (×2): 2 mg via INTRAVENOUS
  Filled 2020-11-19 (×9): qty 1

## 2020-11-19 MED ORDER — ADULT MULTIVITAMIN W/MINERALS CH
1.0000 | ORAL_TABLET | Freq: Every day | ORAL | Status: DC
  Administered 2020-11-20 – 2020-11-23 (×4): 1 via ORAL
  Filled 2020-11-19 (×4): qty 1

## 2020-11-19 MED ORDER — FOLIC ACID 5 MG/ML IJ SOLN
1.0000 mg | Freq: Every day | INTRAMUSCULAR | Status: DC
  Filled 2020-11-19: qty 0.2

## 2020-11-19 MED ORDER — SODIUM CHLORIDE 0.9 % IV BOLUS
1000.0000 mL | Freq: Once | INTRAVENOUS | Status: AC
  Administered 2020-11-19: 1000 mL via INTRAVENOUS

## 2020-11-19 MED ORDER — ENOXAPARIN SODIUM 40 MG/0.4ML ~~LOC~~ SOLN
40.0000 mg | SUBCUTANEOUS | Status: DC
  Administered 2020-11-19 – 2020-11-22 (×4): 40 mg via SUBCUTANEOUS
  Filled 2020-11-19 (×4): qty 0.4

## 2020-11-19 NOTE — H&P (Signed)
History and Physical   Cameron Donovan TIR:[id number] DOB: May 06, 1976 DOA: 11/19/2020  Referring MD/NP/PA: Dr. Anitra Lauth  PCP: Pcp, No   Outpatient Specialists: None  Patient coming from: [facility]  Chief Complaint: Alcohol withdrawals  HPI: Cameron Donovan is a 46 y.o. male with medical history significant of alcohol abuse, hypertension, hypogonadism, anxiety disorder, ADD and vitamin D deficiency who normally drinks about 12-15 beers a day for almost 7 to 10 years.  Patient decided to quit suddenly and stopped drinking cold [place] about 10 days ago.  He has gradually tried to cope with not Drink at home but over the last 2 days he has gotten progressively worse irritable and now unable to function.  He went to the med [facility] where he was very agitated patient had to be restrained.  He was given multiple sedatives.  No suicidal ideation.  He is having severe tremors and anxiety.  Patient requires Precedex.  He has had multiple doses of Ativan and is now transferred to stepdown unit with severe delirium tremens.  He is currently requiring four-point restraints.  Completely altered unable to give any history.  Patient being admitted for management of alcohol withdrawal symptoms..  ED Course: Temperature 99.4 blood pressure 205/74 pulse 98 respiratory 34 oxygen sat 86% on originally on room air currently 100% on room air.  White count 6.0 hemoglobin 12.8 and platelet 160.  Chemistry largely within normal except magnesium 1.6.  Viral assay is negative.  Drug screen also negative except for THC.  Chest x-ray shows no acute finding EKG shows sinus rhythm.  Patient being admitted with severe alcohol withdrawal symptoms..  Review of Systems: As per HPI otherwise 10 point review of systems negative.    Past Medical History:  Diagnosis Date  . ADD (attention deficit disorder)   . Anxiety   . Hypertension   . Hypogonadism male   . Thyroiditis   . Vitamin D deficiency     History  reviewed. No pertinent surgical history.   reports that he has never smoked. His smokeless tobacco use includes chew. He reports current alcohol use of about 6.0 standard drinks of alcohol per week. He reports current drug use. Frequency: 1.00 time per week. Drug: Marijuana.  No Known Allergies  No family history on file.   Prior to Admission medications   Medication Sig Start Date End Date Taking? Authorizing Provider  atenolol (TENORMIN) 50 MG tablet Take 1 tablet (50 mg total) by mouth daily. 08/05/19 08/04/20  Doree Albee, PA-C  chlordiazePOXIDE (LIBRIUM) 25 MG capsule Take 1 capsule (25 mg total) by mouth 2 (two) times daily as needed for withdrawal. 08/05/19   Doree Albee, PA-C  Cholecalciferol 10000 units CAPS Take 1 capsule by mouth daily.    [provider]  ezetimibe (ZETIA) 10 MG tablet Take 1 tablet (10 mg total) by mouth daily. 08/28/18 08/28/19  [name], MD  folic acid-pyridoxine-cyancobalamin (FOLTX) 2.5-25-2 MG TABS tablet Take by mouth. 07/16/19   [provider]  hydrochlorothiazide (HYDRODIURIL) 25 MG tablet Take by mouth. 07/16/19   [provider]  Melatonin 3 MG TABS Take by mouth. 07/16/19   [provider]  minoxidil (LONITEN) 10 MG tablet Take 1 tablet  2 x /day for BP to prevent Heart Attack, Stroke & Kidney Failure 03/23/19   [name], MD  naltrexone (DEPADE) 50 MG tablet Take by mouth. 07/16/19   [provider]  olmesartan (BENICAR) 40 MG tablet Take 1 tablet  every Night for BP 03/23/19   [name], MD  rosuvastatin (CRESTOR) 40 MG tablet Take 1 tablet daily for Cholesterol 12/11/18   [name], NP  sertraline (ZOLOFT) 25 MG tablet Take 1 tablet (25 mg total) by mouth daily. 04/23/19 04/22/20  Elder Negus, NP  sildenafil (VIAGRA) 100 MG tablet Take 1 tablet (100 mg total) by mouth daily as needed for erectile dysfunction. Take 1/2 to 1 tablet daily if needed for XXXX 08/27/18   [name], MD  thiamine 100 MG tablet Take by mouth. 07/16/19   [provider]    Physical Exam: Vitals:   11/19/20 1812 11/19/20 1900 11/19/20 2000 11/19/20 2100  BP: (!) 157/105 (!) 145/104  (!) 174/93  Pulse: 69 (!) 49 (!) 50 70  Resp: 17 12 11 16   Temp: 99.4 F (37.4 C)  (!) 97.5 F (36.4 C)   TempSrc: Oral  Axillary   SpO2: 100% 100% 100% 100%  Weight:      Height:          Constitutional: Currently obtunded not responding Vitals:   11/19/20 1812 11/19/20 1900 11/19/20 2000 11/19/20 2100  BP: (!) 157/105 (!) 145/104  (!) 174/93  Pulse: 69 (!) 49 (!) 50 70  Resp: 17 12 11 16   Temp: 99.4 F (37.4 C)  (!) 97.5 F (36.4 C)   TempSrc: Oral  Axillary   SpO2: 100% 100% 100% 100%  Weight:      Height:       Eyes: PERRL, lids and conjunctivae normal ENMT: Mucous membranes are dry. Posterior pharynx clear of any exudate or lesions.Normal dentition.  Neck: normal, supple, no masses, no thyromegaly Respiratory: clear to auscultation bilaterally, no wheezing, no crackles. Normal respiratory effort. No accessory muscle use.  Cardiovascular: Sinus tachycardia no murmurs / rubs / gallops. No extremity edema. 2+ pedal pulses. No carotid bruits.  Abdomen: no tenderness, no masses palpated. No hepatosplenomegaly. Bowel sounds positive.  Musculoskeletal: no clubbing / cyanosis. No joint deformity upper and lower extremities. Good ROM, no contractures. Normal muscle tone.  Skin: no rashes, lesions, ulcers. No induration Neurologic: CN 2-12 grossly intact. Sensation intact, DTR normal. Strength 5/5 in all 4.  Psychiatric: Sedated, obtunded, no distress, in four-point restraints    Labs on Admission: I have personally reviewed following labs and imaging studies  CBC: Recent Labs  Lab 11/19/20 1016 11/19/20 2105  WBC 5.5 6.0  NEUTROABS 3.9  --   HGB 12.4* 12.8*  HCT 36.4* 38.4*  MCV 99.2 102.1*  PLT 176 160   Basic Metabolic Panel: Recent Labs  Lab 11/19/20 1016  11/19/20 2105  NA 133* 141  K 3.1* 3.8  CL 98 106  CO2 24 26  GLUCOSE 119* 86  BUN 5* <5*  CREATININE 0.84 0.83  CALCIUM 9.1 9.3  MG  --  1.6*  PHOS  --  3.9   GFR: Estimated Creatinine Clearance: 155.6 mL/min (by C-G formula based on SCr of 0.83 mg/dL). Liver Function Tests: Recent Labs  Lab 11/19/20 1016 11/19/20 2105  AST 47* 45*  ALT 45* 44  ALKPHOS 38 38  BILITOT 0.6 1.2  PROT 7.3 7.5  ALBUMIN 4.0 4.2   No results for input(s): LIPASE, AMYLASE in the last 168 hours. No results for input(s): AMMONIA in the last 168 hours. Coagulation Profile: No results for input(s): INR, PROTIME in the last 168 hours. Cardiac Enzymes: No results for input(s): CKTOTAL, CKMB, CKMBINDEX, TROPONINI in the last 168 hours. BNP (last 3  results) No results for input(s): PROBNP in the last 8760 hours. HbA1C: No results for input(s): HGBA1C in the last 72 hours. CBG: No results for input(s): GLUCAP in the last 168 hours. Lipid Profile: No results for input(s): CHOL, HDL, LDLCALC, TRIG, CHOLHDL, LDLDIRECT in the last 72 hours. Thyroid Function Tests: No results for input(s): TSH, T4TOTAL, FREET4, T3FREE, THYROIDAB in the last 72 hours. Anemia Panel: No results for input(s): VITAMINB12, FOLATE, FERRITIN, TIBC, IRON, RETICCTPCT in the last 72 hours. Urine analysis:    Component Value Date/Time   COLORURINE YELLOW 03/23/2019 1000   APPEARANCEUR CLEAR 03/23/2019 1000   LABSPEC 1.015 03/23/2019 1000   PHURINE 6.0 03/23/2019 1000   GLUCOSEU NEGATIVE 03/23/2019 1000   HGBUR NEGATIVE 03/23/2019 1000   BILIRUBINUR NEGATIVE 01/10/2017 0936   KETONESUR NEGATIVE 03/23/2019 1000   PROTEINUR NEGATIVE 03/23/2019 1000   NITRITE NEGATIVE 03/23/2019 1000   LEUKOCYTESUR NEGATIVE 03/23/2019 1000   Sepsis Labs: @LABRCNTIP (procalcitonin:4,lacticidven:4) ) Recent Results (from the past 240 hour(s))  Resp Panel by RT-PCR (Flu A&B, Covid) Nasopharyngeal Swab     Status: None   Collection Time:  11/19/20 12:20 PM   Specimen: Nasopharyngeal Swab; Nasopharyngeal(NP) swabs in vial transport medium  Result Value Ref Range Status   SARS Coronavirus 2 by RT PCR NEGATIVE NEGATIVE Final    Comment: (NOTE) SARS-CoV-2 target nucleic acids are NOT DETECTED.  The SARS-CoV-2 RNA is generally detectable in upper respiratory specimens during the acute phase of infection. The lowest concentration of SARS-CoV-2 viral copies this assay can detect is 138 copies/mL. A negative result does not preclude SARS-Cov-2 infection and should not be used as the sole basis for treatment or other patient management decisions. A negative result may occur with  improper specimen collection/handling, submission of specimen other than nasopharyngeal swab, presence of viral mutation(s) within the areas targeted by this assay, and inadequate number of viral copies(<138 copies/mL). A negative result must be combined with clinical observations, patient history, and epidemiological information. The expected result is Negative.  Fact Sheet for Patients:  11/21/20  Fact Sheet for Healthcare Providers:  BloggerCourse.com  This test is no t yet approved or cleared by the [employer]ious[employer].[place] FDA and  has been authorized for detection and/or diagnosis of SARS-CoV-2 by FDA under an Emergency Use Authorization (EUA). This EUA will remain  in effect (meaning this test can be used) for the duration of the COVID-19 declaration under Section 564(b)(1) of the Act, 21 U.S.C.section 360bbb-3(b)(1), unless the authorization is terminated  or revoked sooner.       Influenza A by PCR NEGATIVE NEGATIVE Final   Influenza B by PCR NEGATIVE NEGATIVE Final    Comment: (NOTE) The Xpert Xpress SARS-CoV-2/FLU/RSV plus assay is intended as an aid in the diagnosis of influenza from Nasopharyngeal swab specimens and should not be used as a sole basis for treatment. Nasal washings  and aspirates are unacceptable for Xpert Xpress SARS-CoV-2/FLU/RSV testing.  Fact Sheet for Patients: [place]  Fact Sheet for Healthcare Providers: BloggerCourse.com  This test is not yet approved or cleared by the [employer]ious[employer]ker.it FDA and has been authorized for detection and/or diagnosis of SARS-CoV-2 by FDA under an Emergency Use Authorization (EUA). This EUA will remain in effect (meaning this test can be used) for the duration of the COVID-19 declaration under Section 564(b)(1) of the Act, 21 U.S.C. section 360bbb-3(b)(1), unless the authorization is terminated or revoked.  Performed at [facility], [address], [place], [place]  Radiological Exams on Admission: DG Chest Portable 1 View  Result Date: 11/19/2020 CLINICAL DATA:  Desaturation. EXAM: PORTABLE CHEST 1 VIEW COMPARISON:  September 04, 2013 FINDINGS: The heart size and mediastinal contours are within normal limits. Both lungs are clear. The visualized skeletal structures are unremarkable. IMPRESSION: No active disease. Electronically Signed   By: Gerome Samavid  Williams III M.D   On: 11/19/2020 13:17    EKG: Independently reviewed.  Sinus rhythm  Assessment/Plan Principal Problem:   Alcohol withdrawal (HCC) Active Problems:   ADD (attention deficit disorder)   Anxiety   Essential hypertension   Hyperlipidemia   Hypomagnesemia     #1 alcohol withdrawals symptoms: Patient has moderately severe withdrawal symptoms.  We will keep him in stepdown unit.  CIWA protocol with Ativan for now.  If not responding we will initiate Precedex.  In the meantime continue supportive care.  Banana bag as well as electrolyte monitoring.  #2 hypomagnesemia: Magnesium 1.6.  Replete magnesium IV.  #3 essential hypertension: Worsened by his agitation.  Empiric IV beta-blockers if needed.  #4 hyperlipidemia: When patient is able to take p.o.'s we will  continue with home regimen.  #5 morbid obesity: At baseline.  #6 anxiety disorder: Patient to be on Ativan protocol.   DVT prophylaxis: Lovenox Code Status: Full code Family Communication: No family at bedside Disposition Plan: To be determined Consults called: None Admission status: Inpatient to stepdown unit  Severity of Illness: The appropriate patient status for this patient is INPATIENT. Inpatient status is judged to be reasonable and necessary in order to provide the required intensity of service to ensure the patient's safety. The patient's presenting symptoms, physical exam findings, and initial radiographic and laboratory data in the context of their chronic comorbidities is felt to place them at high risk for further clinical deterioration. Furthermore, it is not anticipated that the patient will be medically stable for discharge from the hospital within 2 midnights of admission. The following factors support the patient status of inpatient.   " The patient's presenting symptoms include agitation and confusion. " The worrisome physical exam findings include completely obtunded. " The initial radiographic and laboratory data are worrisome because of low magnesium. " The chronic co-morbidities include alcoholism.   * I certify that at the point of admission it is my clinical judgment that the patient will require inpatient hospital care spanning beyond 2 midnights from the point of admission due to high intensity of service, high risk for further deterioration and high frequency of surveillance required.Lonia Blood*    Sarahgrace Broman,LAWAL MD [facility] Pager [phone]  If 7PM-7AM, please contact night-coverage [email or web address] Password [email or web address]  11/19/2020, 10:41 PM

## 2020-11-19 NOTE — ED Notes (Addendum)
Spoke with Cameron Donovan and gave update on medications given - haldol and Versed Advised that [facility] is loading  Up and on the way  Personal belongings were given to daughter Cameron Donovan - including phone  Items on pt jeans, belt and underwear  Daughter Cameron Donovan was given contact information and was advised to wait until after 2000 - r/t transportation, shift change and assessment of pt.

## 2020-11-19 NOTE — ED Provider Notes (Addendum)
[facility] EMERGENCY DEPARTMENT Provider Note   CSN: [id number] Arrival date & time: 11/19/20  3810     History Chief Complaint  Patient presents with  . Withdrawal    Cameron Donovan is a 45 y.o. male.  HPI Patient is a 45 year old male with a history of hypertension, anxiety, ADD, fatty liver, alcoholism, who presents the emergency department due to acute alcohol withdrawals.  Patient states that he typically drinks about 12-15 beers per day and has been doing so for about 7 to 10 years.  His last drink was approximately 10 days ago.  He states that he cannot afford to drink anymore and wanted to improve his health so he quit cold [place].  Denies any nausea, vomiting, chest pain, shortness of breath, diaphoresis, visual or auditory hallucinations.  Denies any suicidal ideation.  Patient states that he has been having diffuse tremors and increased anxiety for most of the week but otherwise has no complaints.    Past Medical History:  Diagnosis Date  . ADD (attention deficit disorder)   . Anxiety   . Hypertension   . Hypogonadism male   . Thyroiditis   . Vitamin D deficiency     Patient Active Problem List   Diagnosis Date Noted  . Alcohol withdrawal (HCC) 11/19/2020  . Fatty liver 05/02/2018  . Obesity (BMI 30.0-34.9) 04/23/2018  . Hyperlipidemia 12/01/2013  . Other abnormal glucose 12/01/2013  . Medication management 12/01/2013  . Anxiety   . Testosterone Deficiency   . Vitamin D deficiency   . Thyroiditis   . Essential hypertension   . ADD (attention deficit disorder)     History reviewed. No pertinent surgical history.     No family history on file.  Social History   Tobacco Use  . Smoking status: Never Smoker  . Smokeless tobacco: Current User    Types: Chew  Vaping Use  . Vaping Use: Never used  Substance Use Topics  . Alcohol use: Yes    Alcohol/week: 6.0 standard drinks    Types: 6 Cans of beer per week    Comment: quit as of 11/16/2020   . Drug use: Yes    Frequency: 1.0 times per week    Types: Marijuana    Home Medications Prior to Admission medications   Medication Sig Start Date End Date Taking? Authorizing Provider  atenolol (TENORMIN) 50 MG tablet Take 1 tablet (50 mg total) by mouth daily. 08/05/19 08/04/20  Doree Albee, PA-C  chlordiazePOXIDE (LIBRIUM) 25 MG capsule Take 1 capsule (25 mg total) by mouth 2 (two) times daily as needed for withdrawal. 08/05/19   Doree Albee, PA-C  Cholecalciferol 10000 units CAPS Take 1 capsule by mouth daily.    [provider]  ezetimibe (ZETIA) 10 MG tablet Take 1 tablet (10 mg total) by mouth daily. 08/28/18 08/28/19  [name], MD  folic acid-pyridoxine-cyancobalamin (FOLTX) 2.5-25-2 MG TABS tablet Take by mouth. 07/16/19   [provider]  hydrochlorothiazide (HYDRODIURIL) 25 MG tablet Take by mouth. 07/16/19   [provider]  Melatonin 3 MG TABS Take by mouth. 07/16/19   [provider]  minoxidil (LONITEN) 10 MG tablet Take 1 tablet  2 x /day for BP to prevent Heart Attack, Stroke & Kidney Failure 03/23/19   [name], MD  naltrexone (DEPADE) 50 MG tablet Take by mouth. 07/16/19   [provider]  olmesartan (BENICAR) 40 MG tablet Take 1 tablet every Night for BP 03/23/19   [name],  MD  rosuvastatin (CRESTOR) 40 MG tablet Take 1 tablet daily for Cholesterol 12/11/18   [name], NP  sertraline (ZOLOFT) 25 MG tablet Take 1 tablet (25 mg total) by mouth daily. 04/23/19 04/22/20  Elder Negus, NP  sildenafil (VIAGRA) 100 MG tablet Take 1 tablet (100 mg total) by mouth daily as needed for erectile dysfunction. Take 1/2 to 1 tablet daily if needed for XXXX 08/27/18   [name], MD  thiamine 100 MG tablet Take by mouth. 07/16/19   [provider]    Allergies    Patient has no known allergies.  Review of Systems   Review of Systems  All other systems reviewed and are negative. Ten  systems reviewed and are negative for acute change, except as noted in the HPI.   Physical Exam Updated Vital Signs BP (!) 142/121   Pulse 77   Temp 98.5 F (36.9 C) (Oral)   Resp 20   Ht 6\' 5"  (1.956 m)   Wt 113.4 kg   SpO2 100%   BMI 29.65 kg/m   Physical Exam Vitals and nursing note reviewed.  Constitutional:      General: He is not in acute distress.    Appearance: Normal appearance. He is diaphoretic. He is not ill-appearing or toxic-appearing.  HENT:     Head: Normocephalic and atraumatic.     Right Ear: External ear normal.     Left Ear: External ear normal.     Nose: Nose normal.     Mouth/Throat:     Mouth: Mucous membranes are moist.     Pharynx: Oropharynx is clear. No oropharyngeal exudate or posterior oropharyngeal erythema.  Eyes:     Extraocular Movements: Extraocular movements intact.  Cardiovascular:     Rate and Rhythm: Normal rate and regular rhythm.     Pulses: Normal pulses.     Heart sounds: Normal heart sounds. No murmur heard. No friction rub. No gallop.   Pulmonary:     Effort: Pulmonary effort is normal. No respiratory distress.     Breath sounds: Normal breath sounds. No stridor. No wheezing, rhonchi or rales.  Abdominal:     General: Abdomen is flat.     Tenderness: There is no abdominal tenderness.  Musculoskeletal:        General: Normal range of motion.     Cervical back: Normal range of motion and neck supple. No tenderness.  Skin:    General: Skin is warm.  Neurological:     General: No focal deficit present.     Mental Status: He is alert and oriented to person, place, and time.     Comments: Patient is oriented to person, place, and time. Patient phonates in clear, complete, and coherent sentences. Negative arm drift.  Moving all 4 extremities with ease.  No gross deficits.  Tremors noted across the face and all 4 extremities.  Psychiatric:        Attention and Perception: Attention normal.        Mood and Affect: Mood is anxious.         Speech: Speech is rapid and pressured.        Behavior: Behavior normal.    ED Results / Procedures / Treatments   Labs (all labs ordered are listed, but only abnormal results are displayed) Labs Reviewed  COMPREHENSIVE METABOLIC PANEL - Abnormal; Notable for the following components:      Result Value   Sodium 133 (*)    Potassium 3.1 (*)  Glucose, Bld 119 (*)    BUN 5 (*)    AST 47 (*)    ALT 45 (*)    All other components within normal limits  CBC WITH DIFFERENTIAL/PLATELET - Abnormal; Notable for the following components:   RBC 3.67 (*)    Hemoglobin 12.4 (*)    HCT 36.4 (*)    Lymphs Abs 0.6 (*)    All other components within normal limits  ACETAMINOPHEN LEVEL - Abnormal; Notable for the following components:   Acetaminophen (Tylenol), Serum <10 (*)    All other components within normal limits  RAPID URINE DRUG SCREEN, HOSP PERFORMED - Abnormal; Notable for the following components:   Tetrahydrocannabinol POSITIVE (*)    All other components within normal limits  SALICYLATE LEVEL - Abnormal; Notable for the following components:   Salicylate Lvl <7.0 (*)    All other components within normal limits  RESP PANEL BY RT-PCR (FLU A&B, COVID) [id number]  ETHANOL   EKG EKG Interpretation  Date/Time:  Saturday November 19 2020 10:19:54 EDT Ventricular Rate:  59 PR Interval:  63 QRS Duration: 106 QT Interval:  449 QTC Calculation: 445 R Axis:   52 Text Interpretation: Sinus rhythm Atrial premature complex Short PR interval Abnormal R-wave progression, early transition No previous tracing Confirmed by Gwyneth SproutPlunkett, Whitney ([id number]) on 11/19/2020 10:57:30 AM  Radiology DG Chest Portable 1 View  Result Date: 11/19/2020 CLINICAL DATA:  Desaturation. EXAM: PORTABLE CHEST 1 VIEW COMPARISON:  September 04, 2013 FINDINGS: The heart size and mediastinal contours are within normal limits. Both lungs are clear. The visualized skeletal structures are unremarkable. IMPRESSION: No active  disease. Electronically Signed   By: Gerome Samavid  Williams III M.D   On: 11/19/2020 13:17    Procedures .Critical Care Performed by: Placido SouJoldersma, Yumna Ebers, PA-C Authorized by: Placido SouJoldersma, Dannielle Baskins, PA-C   Critical care provider statement:    Critical care time (minutes):  50   Critical care was necessary to treat or prevent imminent or life-threatening deterioration of the following conditions: Severe alcohol withdrawal.   Critical care was time spent personally by me on the following activities:  Discussions with consultants, evaluation of patient's response to treatment, examination of patient, ordering and performing treatments and interventions, ordering and review of laboratory studies, ordering and review of radiographic studies, pulse oximetry, re-evaluation of patient's condition, obtaining history from patient or surrogate and review of old charts     Medications Ordered in ED Medications  LORazepam (ATIVAN) injection 2 mg (has no administration in time range)  LORazepam (ATIVAN) injection 1 mg (1 mg Intravenous Given 11/19/20 1030)  sodium chloride 0.9 % bolus 1,000 mL (0 mLs Intravenous Stopped 11/19/20 1142)  potassium chloride SA (KLOR-CON) CR tablet 40 mEq (40 mEq Oral Given 11/19/20 1149)  LORazepam (ATIVAN) injection 1 mg (1 mg Intravenous Given 11/19/20 1146)  LORazepam (ATIVAN) injection 2 mg (2 mg Intravenous Given 11/19/20 1226)  thiamine (B-1) injection 100 mg (100 mg Intravenous Given 11/19/20 1335)   ED Course  I have reviewed the triage vital signs and the nursing notes.  Pertinent labs & imaging results that were available during my care of the patient were reviewed by me and considered in my medical decision making (see chart for details).  Clinical Course as of 11/19/20 1454  Sat Nov 19, 2020  1015 Initial CIWA of 12.  We will give IV Ativan as well as IV fluids.  Basic labs.  Will monitor and reassess. [LJ]  1056 Patient reassessed.  We will give Klor-Con  for his hypokalemia.   Still having some faint tremors.  We will give an additional dose of Ativan and reassess. [LJ]  1213 Patient reassessed after being given 2 doses of Ativan.  Tremors seem to have worsened.  Now notes visual hallucinations.  Patient saw the lamp above his bed and thought it was moving towards him.  His daughter is now at bedside and confirms that he has been having visual hallucinations for the past few days. [LJ]    Clinical Course User Index [LJ] Placido Sou, PA-C   MDM Rules/Calculators/A&P                          Patient is a 45 year old male who presents the emergency department due to what appears to be acute alcohol withdrawal.  Initial CIWA calculated at 12 and patient given 2 doses of Ativan.  Patient reassessed and now appears to have worsening symptoms.  Tremors seem worse, patient diaphoretic, ataxic gait, and his daughter is now at bedside.  His daughter states that he has been having visual hallucinations for the past 2 to 3 days.  Patient lives with a roommate.  His roommate told her that he was complaining of an alien in the ceiling that was crying on the walls.  At 1 point he was concerned that people were trying to break into his home and had barricaded himself in his house with a loaded weapon.  While I am at bedside now patient is complaining that the lamp in the ceiling is moving towards him and he thought it was going to hit him.  Patient and his daughter deny any history of DTs or seizures in the past.  We will give an additional 2 mg of IV Ativan.  Respiratory panel.  Will admit to the medicine team.  Hospitalist requested a banana bag as well as a chest x-ray.  I will order these at this time.  Patient will be admitted to the medicine team.  Pharmacy notified me that we do not have the ingredients for the banana bag.  We do have IV thiamine.  We will give this to the patient at this time.  Final Clinical Impression(s) / ED Diagnoses Final diagnoses:  Alcohol  withdrawal syndrome, with delirium [facility])   Rx / DC Orders ED Discharge Orders    None       Placido Sou, PA-C 11/19/20 1247    Placido Sou, PA-C 11/19/20 1249    Placido Sou, PA-C 11/19/20 1454    Gwyneth Sprout, MD 11/19/20 1456

## 2020-11-19 NOTE — ED Notes (Addendum)
Pt unable to sign consent to transfer - daughter signed pt stable but having hallucinations  Pt states that he has not taken any meds in over 6 months r/t no insurance

## 2020-11-19 NOTE — ED Notes (Signed)
Restraints not needed - were not applied

## 2020-11-19 NOTE — ED Triage Notes (Signed)
Pt reports drinking heavily for past 7 years since his wife died. Pt is trying to quit, last drink was Wednesday. Pt presents with severe tremors and intermittent headache.

## 2020-11-19 NOTE — ED Notes (Addendum)
10-15 beers a day for 10 years  Last drink 11/09/20 Denies N/V

## 2020-11-19 NOTE — ED Notes (Signed)
Dialed phone # for pt using our house phone - pt gave daughter an update. Pt has call bell, and has been advised of the ED process  Lights turned down for comfort Pt advise that urine is needed and to use call bell when able

## 2020-11-19 NOTE — ED Notes (Addendum)
Pocketknife placed in bag, labeled and given to security.

## 2020-11-19 NOTE — Progress Notes (Signed)
7.0 Nasal Trumpet inserted to help with OSA and oxygenation. ED MD aware and at bedside. Care link here to transport pt to [facility].

## 2020-11-19 NOTE — ED Notes (Signed)
ED Provider at bedside. 

## 2020-11-19 NOTE — ED Notes (Signed)
Pt pulling ECG leads off, attempting to pull NBP cuff off as well, is taking off pt gown and trying to get off stretcher, in to speak with pt and attempted to reorient client, explained the need to remain on stretcher. Explained care being given. Pt now calmer, Door to room remains open for constant observation, sr x 2 up, family in room. Charge RN and Primary ED Provider aware of pts current behavior

## 2020-11-19 NOTE — ED Notes (Addendum)
Pt trying to get out of bed and three staff members to keep pt in bed  Spoke with MD Silverio Lay

## 2020-11-19 NOTE — ED Notes (Addendum)
Pt tried to give urine sample - but was unable Daughter at [facility] - providing more history - pt has not had his daily medications in over 6 months Pt broke left foot last April and did not have surgery - no insurace  Pt ambulated to the bathroom - gait unsteady - worse since arriving  Pt given crackers and soda for potassium  Back on monitor and has call bell

## 2020-11-20 LAB — CBC
HCT: 40.3 % (ref 39.0–52.0)
Hemoglobin: 13.6 g/dL (ref 13.0–17.0)
MCH: 33.8 pg (ref 26.0–34.0)
MCHC: 33.7 g/dL (ref 30.0–36.0)
MCV: 100.2 fL — ABNORMAL HIGH (ref 80.0–100.0)
Platelets: 152 10*3/uL (ref 150–400)
RBC: 4.02 MIL/uL — ABNORMAL LOW (ref 4.22–5.81)
RDW: 12.8 % (ref 11.5–15.5)
WBC: 5 10*3/uL (ref 4.0–10.5)
nRBC: 0 % (ref 0.0–0.2)

## 2020-11-20 LAB — COMPREHENSIVE METABOLIC PANEL
ALT: 40 U/L (ref 0–44)
AST: 38 U/L (ref 15–41)
Albumin: 3.7 g/dL (ref 3.5–5.0)
Alkaline Phosphatase: 34 U/L — ABNORMAL LOW (ref 38–126)
Anion gap: 8 (ref 5–15)
BUN: <5 mg/dL — ABNORMAL LOW (ref 6–20)
CO2: 25 mmol/L (ref 22–32)
Calcium: 8.9 mg/dL (ref 8.9–10.3)
Chloride: 106 mmol/L (ref 98–111)
Creatinine, Ser: 0.71 mg/dL (ref 0.61–1.24)
GFR, Estimated: >60 mL/min (ref >60)
Glucose, Bld: 86 mg/dL (ref 70–99)
Potassium: 3.6 mmol/L (ref 3.5–5.1)
Sodium: 139 mmol/L (ref 135–145)
Total Bilirubin: 1.1 mg/dL (ref 0.3–1.2)
Total Protein: 7 g/dL (ref 6.5–8.1)

## 2020-11-20 LAB — HIV ANTIBODY (ROUTINE TESTING W REFLEX): HIV Screen 4th Generation wRfx: NONREACTIVE

## 2020-11-20 MED ORDER — SENNA 8.6 MG PO TABS: 1.0000 | ORAL_TABLET | Freq: Every day | ORAL | Status: DC | PRN

## 2020-11-20 MED ORDER — CLONIDINE HCL 0.1 MG PO TABS: 0.1000 mg | ORAL_TABLET | Freq: Every day | ORAL | Status: DC

## 2020-11-20 MED ORDER — SODIUM CHLORIDE 0.9 % IV SOLN
12.5000 mg | Freq: Four times a day (QID) | INTRAVENOUS | Status: DC | PRN
  Filled 2020-11-20: qty 0.5

## 2020-11-20 MED ORDER — LORAZEPAM 2 MG/ML IJ SOLN: 2.0000 mg | Freq: Once | INTRAMUSCULAR | Status: DC

## 2020-11-20 MED ORDER — CLONIDINE HCL 0.1 MG PO TABS: 0.1000 mg | ORAL_TABLET | ORAL | Status: DC

## 2020-11-20 MED ORDER — THIAMINE HCL 100 MG/ML IJ SOLN: 100.0000 mg | Freq: Once | INTRAMUSCULAR | Status: DC

## 2020-11-20 MED ORDER — MIDAZOLAM HCL 2 MG/2ML IJ SOLN: 4.0000 mg | Freq: Once | INTRAMUSCULAR | Status: DC

## 2020-11-20 MED ORDER — IBUPROFEN 200 MG PO TABS: 200.0000 mg | ORAL_TABLET | Freq: Four times a day (QID) | ORAL | Status: DC | PRN

## 2020-11-20 MED ORDER — NICOTINE 21 MG/24HR TD PT24
21.0000 mg | MEDICATED_PATCH | Freq: Every day | TRANSDERMAL | Status: DC
  Administered 2020-11-20 – 2020-11-23 (×4): 21 mg via TRANSDERMAL
  Filled 2020-11-20 (×4): qty 1

## 2020-11-20 MED ORDER — POLYETHYLENE GLYCOL 3350 17 G PO PACK: 17.0000 g | PACK | Freq: Two times a day (BID) | ORAL | Status: DC | PRN

## 2020-11-20 MED ORDER — MAGNESIUM SULFATE 2 GM/50ML IV SOLN
2.0000 g | Freq: Once | INTRAVENOUS | Status: AC
  Administered 2020-11-20: 2 g via INTRAVENOUS
  Filled 2020-11-20: qty 50

## 2020-11-20 MED ORDER — HALOPERIDOL LACTATE 5 MG/ML IJ SOLN: 5.0000 mg | Freq: Once | INTRAMUSCULAR | Status: DC

## 2020-11-20 MED ORDER — CLONIDINE HCL 0.1 MG PO TABS
0.1000 mg | ORAL_TABLET | Freq: Four times a day (QID) | ORAL | Status: AC
  Administered 2020-11-20 – 2020-11-21 (×5): 0.1 mg via ORAL
  Filled 2020-11-20 (×5): qty 1

## 2020-11-20 MED ORDER — THIAMINE HCL 100 MG PO TABS
100.0000 mg | ORAL_TABLET | Freq: Every day | ORAL | Status: DC
  Administered 2020-11-20 – 2020-11-23 (×4): 100 mg via ORAL
  Filled 2020-11-20 (×4): qty 1

## 2020-11-20 MED ORDER — HYDROXYZINE HCL 50 MG/ML IM SOLN
50.0000 mg | Freq: Four times a day (QID) | INTRAMUSCULAR | Status: DC | PRN
Start: 2020-11-20 — End: 2020-11-20
  Filled 2020-11-20: qty 1

## 2020-11-20 NOTE — Progress Notes (Signed)
Patient's daughter requesting assistance with applying for Medicaid for patient. Patient's daughter contacted who agrees with referral to [employer] for assistance with this .   Referral emailed to [facility], [place] Transition of Care [phone]

## 2020-11-20 NOTE — Progress Notes (Signed)
Cameron Donovan  IOE:[id number] DOB: 1976/07/02 DOA: 11/19/2020 PCP: Pcp, No    Brief Narrative:  44yo with a history of alcohol abuse, HTN, hypogonadism, anxiety disorder, ADD, and vitamin D deficiency who has consumed on average 12-15 beers a day for the past 7 to 10 years.  10 days prior to this admission he decided to abruptly stop drinking cold [place].  As time has progressed he has become progressively more irritable and tremulous and presented to [facility] 4/23 stating he "could not function."  He was found to be very agitated and required restraints as well as sedatives.  Consultants:  None  Code Status: FULL CODE  Antimicrobials:  None  DVT prophylaxis: Lovenox  Subjective: Afebrile.  Blood pressure elevated at 164-180 systolic.  No significant tachycardia.  Saturations 99% on room air.  Alert and conversant though mildly confused.  Relatively calm at the time of my visit.  His RN states that he has been improving this morning.  Assessment & Plan:  Alcohol withdrawal  Continue Ativan via CIWA protocol - added clonidine in hopes of avoiding progression to Precedex  Hypomagnesemia Due to poor nutrition and alcoholism - supplement to goal of 2.0  Essential HTN Exacerbated acutely by severe agitation -titrate medications as needed  HLD Hold medical therapy until patient more calm and intake more consistent  Anxiety disorder  Overweight  - Body mass index is 29.72 kg/m.  Family Communication:  Status is: Inpatient  Remains inpatient appropriate because:Inpatient level of care appropriate due to severity of illness   Dispo: The patient is from: Home              Anticipated d/c is to: Home              Patient currently is not medically stable to d/c.   Difficult to place patient No    Objective: Blood pressure (!) 180/100, pulse (!) 49, temperature 97.7 F (36.5 C), temperature source Axillary, resp. rate 14, height 6\' 5"  (1.956 m), weight 113.7 kg, SpO2 95  %.  Intake/Output Summary (Last 24 hours) at 11/20/2020 0739 Last data filed at 11/20/2020 0630 Gross per 24 hour  Intake --  Output 950 ml  Net -950 ml   Filed Weights   11/19/20 0956 11/19/20 1809  Weight: 113.4 kg 113.7 kg    Examination: General: No acute respiratory distress -alert but mildly disoriented -tremulous Lungs: Clear to auscultation bilaterally without wheezes or crackles Cardiovascular: Regular rate and rhythm without murmur gallop or rub normal S1 and S2 Abdomen: Nontender, nondistended, soft, bowel sounds positive, no rebound, no ascites, no appreciable mass Extremities: No significant cyanosis, clubbing, or edema bilateral lower extremities  CBC: Recent Labs  Lab 11/19/20 1016 11/19/20 2105 11/20/20 0310  WBC 5.5 6.0 5.0  NEUTROABS 3.9  --   --   HGB 12.4* 12.8* 13.6  HCT 36.4* 38.4* 40.3  MCV 99.2 102.1* 100.2*  PLT 176 160 152   Basic Metabolic Panel: Recent Labs  Lab 11/19/20 1016 11/19/20 2105 11/20/20 0310  NA 133* 141 139  K 3.1* 3.8 3.6  CL 98 106 106  CO2 24 26 25   GLUCOSE 119* 86 86  BUN 5* <5* <5*  CREATININE 0.84 0.83 0.71  CALCIUM 9.1 9.3 8.9  MG  --  1.6*  --   PHOS  --  3.9  --    GFR: Estimated Creatinine Clearance: 161.4 mL/min (by C-G formula based on SCr of 0.71 mg/dL).  Liver Function Tests: Recent  Labs  Lab 11/19/20 1016 11/19/20 2105 11/20/20 0310  AST 47* 45* 38  ALT 45* 44 40  ALKPHOS 38 38 34*  BILITOT 0.6 1.2 1.1  PROT 7.3 7.5 7.0  ALBUMIN 4.0 4.2 3.7    HbA1C: Hgb A1c MFr Bld  Date/Time Value Ref Range Status  03/23/2019 10:00 AM 5.3 <5.7 % of total Hgb Final    Comment:    For the purpose of screening for the presence of diabetes: . <5.7%       Consistent with the absence of diabetes 5.7-6.4%    Consistent with increased risk for diabetes             (prediabetes) > or =6.5%  Consistent with diabetes . This assay result is consistent with a decreased risk of diabetes. . Currently, no  consensus exists regarding use of hemoglobin A1c for diagnosis of diabetes in children. . According to American Diabetes Association (ADA) guidelines, hemoglobin A1c <7.0% represents optimal control in non-pregnant diabetic patients. Different metrics may apply to specific patient populations.  Standards of Medical Care in Diabetes(ADA). Marland Kitchen   08/27/2018 03:29 PM 5.4 <5.7 % of total Hgb Final    Comment:    For the purpose of screening for the presence of diabetes: . <5.7%       Consistent with the absence of diabetes 5.7-6.4%    Consistent with increased risk for diabetes             (prediabetes) > or =6.5%  Consistent with diabetes . This assay result is consistent with a decreased risk of diabetes. . Currently, no consensus exists regarding use of hemoglobin A1c for diagnosis of diabetes in children. . According to American Diabetes Association (ADA) guidelines, hemoglobin A1c <7.0% represents optimal control in non-pregnant diabetic patients. Different metrics may apply to specific patient populations.  Standards of Medical Care in Diabetes(ADA). Marland Kitchen      Recent Results (from the past 240 hour(s))  Resp Panel by RT-PCR (Flu A&B, Covid) Nasopharyngeal Swab     Status: None   Collection Time: 11/19/20 12:20 PM   Specimen: Nasopharyngeal Swab; Nasopharyngeal(NP) swabs in vial transport medium  Result Value Ref Range Status   SARS Coronavirus 2 by RT PCR NEGATIVE NEGATIVE Final    Comment: (NOTE) SARS-CoV-2 target nucleic acids are NOT DETECTED.  The SARS-CoV-2 RNA is generally detectable in upper respiratory specimens during the acute phase of infection. The lowest concentration of SARS-CoV-2 viral copies this assay can detect is 138 copies/mL. A negative result does not preclude SARS-Cov-2 infection and should not be used as the sole basis for treatment or other patient management decisions. A negative result may occur with  improper specimen collection/handling,  submission of specimen other than nasopharyngeal swab, presence of viral mutation(s) within the areas targeted by this assay, and inadequate number of viral copies(<138 copies/mL). A negative result must be combined with clinical observations, patient history, and epidemiological information. The expected result is Negative.  Fact Sheet for Patients:  BloggerCourse.com  Fact Sheet for Healthcare Providers:  SeriousBroker.it  This test is no t yet approved or cleared by the [place] FDA and  has been authorized for detection and/or diagnosis of SARS-CoV-2 by FDA under an Emergency Use Authorization (EUA). This EUA will remain  in effect (meaning this test can be used) for the duration of the COVID-19 declaration under Section 564(b)(1) of the Act, 21 U.S.C.section 360bbb-3(b)(1), unless the authorization is terminated  or revoked sooner.  Influenza A by PCR NEGATIVE NEGATIVE Final   Influenza B by PCR NEGATIVE NEGATIVE Final    Comment: (NOTE) The Xpert Xpress SARS-CoV-2/FLU/RSV plus assay is intended as an aid in the diagnosis of influenza from Nasopharyngeal swab specimens and should not be used as a sole basis for treatment. Nasal washings and aspirates are unacceptable for Xpert Xpress SARS-CoV-2/FLU/RSV testing.  Fact Sheet for Patients: BloggerCourse.com  Fact Sheet for Healthcare Providers: SeriousBroker.it  This test is not yet approved or cleared by the [place] FDA and has been authorized for detection and/or diagnosis of SARS-CoV-2 by FDA under an Emergency Use Authorization (EUA). This EUA will remain in effect (meaning this test can be used) for the duration of the COVID-19 declaration under Section 564(b)(1) of the Act, 21 U.S.C. section 360bbb-3(b)(1), unless the authorization is terminated or revoked.  Performed at [facility], [address] [place], [place] [address]      Scheduled Meds: . Chlorhexidine Gluconate Cloth  6 each Topical Daily  . enoxaparin (LOVENOX) injection  40 mg Subcutaneous Q24H  . folic acid  1 mg Oral Daily  . folic acid  1 mg Intravenous Daily  . multivitamin with minerals  1 tablet Oral Daily     LOS: 1 day   Lonia Blood, MD [facility] Office  [phone] Pager - Text Page per Amion  If 7PM-7AM, please contact night-coverage per Amion 11/20/2020, 7:39 AM

## 2020-11-21 LAB — COMPREHENSIVE METABOLIC PANEL
ALT: 33 U/L (ref 0–44)
AST: 31 U/L (ref 15–41)
Albumin: 3.5 g/dL (ref 3.5–5.0)
Alkaline Phosphatase: 52 U/L (ref 38–126)
Anion gap: 8 (ref 5–15)
BUN: 7 mg/dL (ref 6–20)
CO2: 25 mmol/L (ref 22–32)
Calcium: 8.7 mg/dL — ABNORMAL LOW (ref 8.9–10.3)
Chloride: 105 mmol/L (ref 98–111)
Creatinine, Ser: 0.92 mg/dL (ref 0.61–1.24)
GFR, Estimated: >60 mL/min (ref >60)
Glucose, Bld: 108 mg/dL — ABNORMAL HIGH (ref 70–99)
Potassium: 3.4 mmol/L — ABNORMAL LOW (ref 3.5–5.1)
Sodium: 138 mmol/L (ref 135–145)
Total Bilirubin: 0.8 mg/dL (ref 0.3–1.2)
Total Protein: 6.5 g/dL (ref 6.5–8.1)

## 2020-11-21 LAB — MAGNESIUM: Magnesium: 2 mg/dL (ref 1.7–2.4)

## 2020-11-21 LAB — CBC
HCT: 34.5 % — ABNORMAL LOW (ref 39.0–52.0)
Hemoglobin: 11.3 g/dL — ABNORMAL LOW (ref 13.0–17.0)
MCH: 33.5 pg (ref 26.0–34.0)
MCHC: 32.8 g/dL (ref 30.0–36.0)
MCV: 102.4 fL — ABNORMAL HIGH (ref 80.0–100.0)
Platelets: 169 10*3/uL (ref 150–400)
RBC: 3.37 MIL/uL — ABNORMAL LOW (ref 4.22–5.81)
RDW: 12.7 % (ref 11.5–15.5)
WBC: 5.2 10*3/uL (ref 4.0–10.5)
nRBC: 0 % (ref 0.0–0.2)

## 2020-11-21 LAB — PHOSPHORUS: Phosphorus: 3.6 mg/dL (ref 2.5–4.6)

## 2020-11-21 MED ORDER — CLONIDINE HCL 0.1 MG PO TABS: 0.1000 mg | ORAL_TABLET | Freq: Every day | ORAL | Status: DC

## 2020-11-21 MED ORDER — IRBESARTAN 300 MG PO TABS
300.0000 mg | ORAL_TABLET | Freq: Every day | ORAL | Status: DC
  Administered 2020-11-21 – 2020-11-23 (×3): 300 mg via ORAL
  Filled 2020-11-21 (×3): qty 1

## 2020-11-21 MED ORDER — CLONIDINE HCL 0.1 MG PO TABS
0.1000 mg | ORAL_TABLET | ORAL | Status: DC
  Administered 2020-11-22 – 2020-11-23 (×3): 0.1 mg via ORAL
  Filled 2020-11-21 (×3): qty 1

## 2020-11-21 MED ORDER — MINOXIDIL 10 MG PO TABS
10.0000 mg | ORAL_TABLET | Freq: Two times a day (BID) | ORAL | Status: DC
  Administered 2020-11-21 – 2020-11-23 (×4): 10 mg via ORAL
  Filled 2020-11-21 (×4): qty 1

## 2020-11-21 MED ORDER — HYDRALAZINE HCL 20 MG/ML IJ SOLN
10.0000 mg | Freq: Once | INTRAMUSCULAR | Status: AC
  Administered 2020-11-21: 10 mg via INTRAVENOUS
  Filled 2020-11-21: qty 1

## 2020-11-21 MED ORDER — SERTRALINE HCL 25 MG PO TABS
25.0000 mg | ORAL_TABLET | Freq: Every day | ORAL | Status: DC
  Administered 2020-11-21 – 2020-11-23 (×3): 25 mg via ORAL
  Filled 2020-11-21 (×3): qty 1

## 2020-11-21 NOTE — Plan of Care (Signed)
  Problem: Education: Goal: Knowledge of General Education information will improve Description: Including pain rating scale, medication(s)/side effects and non-pharmacologic comfort measures Outcome: Progressing   Problem: Clinical Measurements: Goal: Respiratory complications will improve Outcome: Progressing   Problem: Nutrition: Goal: Adequate nutrition will be maintained Outcome: Progressing   Problem: Pain Managment: Goal: General experience of comfort will improve Outcome: Progressing   Problem: Safety: Goal: Ability to remain free from injury will improve Outcome: Progressing   

## 2020-11-21 NOTE — Progress Notes (Signed)
Cameron Donovan  OVF:[id number] DOB: 06-06-1976 DOA: 11/19/2020 PCP: Pcp, No    Brief Narrative:  44yo with a history of alcohol abuse, HTN, hypogonadism, anxiety disorder, ADD, and vitamin D deficiency who has consumed on average 12-15 beers a day for the past 7 to 10 years.  10 days prior to this admission he decided to abruptly stop drinking cold [place].  As time has progressed he has become progressively more irritable and tremulous and presented to [facility] 4/23 stating he "could not function."  He was found to be very agitated and required restraints as well as sedatives.  Consultants:  None  Code Status: FULL CODE  Antimicrobials:  None  DVT prophylaxis: Lovenox  Subjective: BP trending upward. Persisting bradycardia noted. Afebrile. More alert and less tremulous today. No new complaints. Reports he is feeling "more calm" and better overall today.   Assessment & Plan:  Alcohol withdrawal  Continue Ativan via CIWA protocol as well as clonidine taper - clinically improving - stable for transfer to a Progressive Care unit   Hypomagnesemia Corrected w/ supplementation   Hypokalemia  Supplement to goal of 4.0  Macrocytic anemia  Due to alcohol abuse - Hgb stable   Essential HTN Exacerbated acutely by severe agitation - continue clonidine - avoid overaggressive correction until withdrawal improved further   HLD Hold medical therapy until patient more calm and intake more consistent  Anxiety disorder  Overweight  - Body mass index is 29.72 kg/m.  Family Communication:  Status is: Inpatient  Remains inpatient appropriate because:Inpatient level of care appropriate due to severity of illness   Dispo: The patient is from: Home              Anticipated d/c is to: Home              Patient currently is not medically stable to d/c.   Difficult to place patient No    Objective: Blood pressure (!) 166/102, pulse (!) 48, temperature 97.8 F (36.6 C), temperature source  Axillary, resp. rate 15, height 6\' 5"  (1.956 m), weight 113.7 kg, SpO2 95 %.  Intake/Output Summary (Last 24 hours) at 11/21/2020 0740 Last data filed at 11/20/2020 1900 Gross per 24 hour  Intake 518.54 ml  Output 1375 ml  Net -856.46 ml   Filed Weights   11/19/20 0956 11/19/20 1809  Weight: 113.4 kg 113.7 kg    Examination: General: No acute respiratory distress - alert and oriented today  Lungs: Clear to auscultation bilaterally - no wheezing  Cardiovascular: bradycardia - no M or rub  Abdomen: NT/ND, soft, bs+, no mass  Extremities: No signif edema bilateral lower extremities  CBC: Recent Labs  Lab 11/19/20 1016 11/19/20 2105 11/20/20 0310 11/21/20 0256  WBC 5.5 6.0 5.0 5.2  NEUTROABS 3.9  --   --   --   HGB 12.4* 12.8* 13.6 11.3*  HCT 36.4* 38.4* 40.3 34.5*  MCV 99.2 102.1* 100.2* 102.4*  PLT 176 160 152 169   Basic Metabolic Panel: Recent Labs  Lab 11/19/20 2105 11/20/20 0310 11/21/20 0256  NA 141 139 138  K 3.8 3.6 3.4*  CL 106 106 105  CO2 26 25 25   GLUCOSE 86 86 108*  BUN <5* <5* 7  CREATININE 0.83 0.71 0.92  CALCIUM 9.3 8.9 8.7*  MG 1.6*  --  2.0  PHOS 3.9  --  3.6   GFR: Estimated Creatinine Clearance: 140.3 mL/min (by C-G formula based on SCr of 0.92 mg/dL).  Liver Function  Tests: Recent Labs  Lab 11/19/20 1016 11/19/20 2105 11/20/20 0310 11/21/20 0256  AST 47* 45* 38 31  ALT 45* 44 40 33  ALKPHOS 38 38 34* 52  BILITOT 0.6 1.2 1.1 0.8  PROT 7.3 7.5 7.0 6.5  ALBUMIN 4.0 4.2 3.7 3.5    HbA1C: Hgb A1c MFr Bld  Date/Time Value Ref Range Status  03/23/2019 10:00 AM 5.3 <5.7 % of total Hgb Final    Comment:    For the purpose of screening for the presence of diabetes: . <5.7%       Consistent with the absence of diabetes 5.7-6.4%    Consistent with increased risk for diabetes             (prediabetes) > or =6.5%  Consistent with diabetes . This assay result is consistent with a decreased risk of diabetes. . Currently, no  consensus exists regarding use of hemoglobin A1c for diagnosis of diabetes in children. . According to American Diabetes Association (ADA) guidelines, hemoglobin A1c <7.0% represents optimal control in non-pregnant diabetic patients. Different metrics may apply to specific patient populations.  Standards of Medical Care in Diabetes(ADA). Marland Kitchen   08/27/2018 03:29 PM 5.4 <5.7 % of total Hgb Final    Comment:    For the purpose of screening for the presence of diabetes: . <5.7%       Consistent with the absence of diabetes 5.7-6.4%    Consistent with increased risk for diabetes             (prediabetes) > or =6.5%  Consistent with diabetes . This assay result is consistent with a decreased risk of diabetes. . Currently, no consensus exists regarding use of hemoglobin A1c for diagnosis of diabetes in children. . According to American Diabetes Association (ADA) guidelines, hemoglobin A1c <7.0% represents optimal control in non-pregnant diabetic patients. Different metrics may apply to specific patient populations.  Standards of Medical Care in Diabetes(ADA). Marland Kitchen      Recent Results (from the past 240 hour(s))  Resp Panel by RT-PCR (Flu A&B, Covid) Nasopharyngeal Swab     Status: None   Collection Time: 11/19/20 12:20 PM   Specimen: Nasopharyngeal Swab; Nasopharyngeal(NP) swabs in vial transport medium  Result Value Ref Range Status   SARS Coronavirus 2 by RT PCR NEGATIVE NEGATIVE Final    Comment: (NOTE) SARS-CoV-2 target nucleic acids are NOT DETECTED.  The SARS-CoV-2 RNA is generally detectable in upper respiratory specimens during the acute phase of infection. The lowest concentration of SARS-CoV-2 viral copies this assay can detect is 138 copies/mL. A negative result does not preclude SARS-Cov-2 infection and should not be used as the sole basis for treatment or other patient management decisions. A negative result may occur with  improper specimen collection/handling,  submission of specimen other than nasopharyngeal swab, presence of viral mutation(s) within the areas targeted by this assay, and inadequate number of viral copies(<138 copies/mL). A negative result must be combined with clinical observations, patient history, and epidemiological information. The expected result is Negative.  Fact Sheet for Patients:  BloggerCourse.com  Fact Sheet for Healthcare Providers:  SeriousBroker.it  This test is no t yet approved or cleared by the [place] FDA and  has been authorized for detection and/or diagnosis of SARS-CoV-2 by FDA under an Emergency Use Authorization (EUA). This EUA will remain  in effect (meaning this test can be used) for the duration of the COVID-19 declaration under Section 564(b)(1) of the Act, 21 U.S.C.section 360bbb-3(b)(1), unless the authorization is  terminated  or revoked sooner.       Influenza A by PCR NEGATIVE NEGATIVE Final   Influenza B by PCR NEGATIVE NEGATIVE Final    Comment: (NOTE) The Xpert Xpress SARS-CoV-2/FLU/RSV plus assay is intended as an aid in the diagnosis of influenza from Nasopharyngeal swab specimens and should not be used as a sole basis for treatment. Nasal washings and aspirates are unacceptable for Xpert Xpress SARS-CoV-2/FLU/RSV testing.  Fact Sheet for Patients: BloggerCourse.com  Fact Sheet for Healthcare Providers: SeriousBroker.it  This test is not yet approved or cleared by the [place] FDA and has been authorized for detection and/or diagnosis of SARS-CoV-2 by FDA under an Emergency Use Authorization (EUA). This EUA will remain in effect (meaning this test can be used) for the duration of the COVID-19 declaration under Section 564(b)(1) of the Act, 21 U.S.C. section 360bbb-3(b)(1), unless the authorization is terminated or revoked.  Performed at [facility], [address] [place], [place] [address]      Scheduled Meds: . Chlorhexidine Gluconate Cloth  6 each Topical Daily  . cloNIDine  0.1 mg Oral QID   Followed by  . [START ON 11/22/2020] cloNIDine  0.1 mg Oral BH-qamhs   Followed by  . [START ON 11/24/2020] cloNIDine  0.1 mg Oral QAC breakfast  . enoxaparin (LOVENOX) injection  40 mg Subcutaneous Q24H  . folic acid  1 mg Oral Daily  . multivitamin with minerals  1 tablet Oral Daily  . nicotine  21 mg Transdermal Daily  . thiamine  100 mg Oral Daily     LOS: 2 days   Lonia Blood, MD [facility] Office  [phone] Pager - Text Page per Amion  If 7PM-7AM, please contact night-coverage per Amion 11/21/2020, 7:40 AM

## 2020-11-21 NOTE — Plan of Care (Signed)

## 2020-11-22 DIAGNOSIS — E782 Mixed hyperlipidemia: Secondary | ICD-10-CM

## 2020-11-22 LAB — COMPREHENSIVE METABOLIC PANEL
ALT: 32 U/L (ref 0–44)
AST: 29 U/L (ref 15–41)
Albumin: 3.8 g/dL (ref 3.5–5.0)
Alkaline Phosphatase: 46 U/L (ref 38–126)
Anion gap: 7 (ref 5–15)
BUN: 7 mg/dL (ref 6–20)
CO2: 26 mmol/L (ref 22–32)
Calcium: 9.1 mg/dL (ref 8.9–10.3)
Chloride: 102 mmol/L (ref 98–111)
Creatinine, Ser: 0.98 mg/dL (ref 0.61–1.24)
GFR, Estimated: >60 mL/min (ref >60)
Glucose, Bld: 129 mg/dL — ABNORMAL HIGH (ref 70–99)
Potassium: 3.4 mmol/L — ABNORMAL LOW (ref 3.5–5.1)
Sodium: 135 mmol/L (ref 135–145)
Total Bilirubin: 0.6 mg/dL (ref 0.3–1.2)
Total Protein: 6.9 g/dL (ref 6.5–8.1)

## 2020-11-22 LAB — CBC
HCT: 38.6 % — ABNORMAL LOW (ref 39.0–52.0)
Hemoglobin: 13 g/dL (ref 13.0–17.0)
MCH: 34 pg (ref 26.0–34.0)
MCHC: 33.7 g/dL (ref 30.0–36.0)
MCV: 101 fL — ABNORMAL HIGH (ref 80.0–100.0)
Platelets: 200 10*3/uL (ref 150–400)
RBC: 3.82 MIL/uL — ABNORMAL LOW (ref 4.22–5.81)
RDW: 12.6 % (ref 11.5–15.5)
WBC: 8.4 10*3/uL (ref 4.0–10.5)
nRBC: 0 % (ref 0.0–0.2)

## 2020-11-22 LAB — PHOSPHORUS: Phosphorus: 4 mg/dL (ref 2.5–4.6)

## 2020-11-22 LAB — MAGNESIUM: Magnesium: 1.8 mg/dL (ref 1.7–2.4)

## 2020-11-22 MED ORDER — MAGNESIUM SULFATE 2 GM/50ML IV SOLN
2.0000 g | Freq: Once | INTRAVENOUS | Status: AC
  Administered 2020-11-22: 2 g via INTRAVENOUS
  Filled 2020-11-22: qty 50

## 2020-11-22 MED ORDER — POTASSIUM CHLORIDE CRYS ER 20 MEQ PO TBCR
40.0000 meq | EXTENDED_RELEASE_TABLET | Freq: Two times a day (BID) | ORAL | Status: DC
  Administered 2020-11-22 – 2020-11-23 (×3): 40 meq via ORAL
  Filled 2020-11-22 (×3): qty 2

## 2020-11-22 NOTE — Plan of Care (Signed)
  Problem: Safety: Goal: Violent Restraint(s) Outcome: Adequate for Discharge   Problem: Safety: Goal: Non-violent Restraint(s) Outcome: Adequate for Discharge   Problem: Education: Goal: Knowledge of General Education information will improve Description: Including pain rating scale, medication(s)/side effects and non-pharmacologic comfort measures Outcome: Progressing   Problem: Activity: Goal: Risk for activity intolerance will decrease Outcome: Progressing   Problem: Nutrition: Goal: Adequate nutrition will be maintained Outcome: Progressing   Problem: Coping: Goal: Level of anxiety will decrease Outcome: Progressing   Problem: Elimination: Goal: Will not experience complications related to urinary retention Outcome: Progressing   Problem: Pain Managment: Goal: General experience of comfort will improve Outcome: Progressing   Problem: Safety: Goal: Ability to remain free from injury will improve Outcome: Progressing   Problem: Skin Integrity: Goal: Risk for impaired skin integrity will decrease Outcome: Progressing   Problem: Education: Goal: Knowledge of disease or condition will improve Outcome: Progressing   Problem: Safety: Goal: Ability to remain free from injury will improve Outcome: Progressing

## 2020-11-22 NOTE — Progress Notes (Addendum)
De Jaworski  GGE:[id number] DOB: [birth year] DOA: 11/19/2020 PCP: Pcp, No    Brief Narrative:  45yo with a history of alcohol abuse, HTN, hypogonadism, anxiety disorder, ADD, and vitamin D deficiency who has consumed on average 12-15 beers a day for the past 7 to 10 years.  10 days prior to this admission he decided to abruptly stop drinking cold [place].  As time has progressed he has become progressively more irritable and tremulous and presented to [facility] 4/23 stating he "could not function."  He was found to be very agitated and required restraints as well as sedatives.  Consultants:  None  Code Status: FULL CODE  Antimicrobials:  None  DVT prophylaxis: Lovenox  Subjective: Afebrile.  Bradycardia improving with decreased dose of clonidine.  Blood pressure stabilizing.  Continues to make neurologic improvement each day.  More alert and conversant today.  Reports he feels less tremulous and anxious.  No chest pain or shortness of breath.  Remains committed to avoiding alcohol moving forward.  Assessment & Plan:  Alcohol withdrawal  Continue clonidine taper -has nearly completed Ativan treatment course - clinically improving -downgrade to med/surg -begin to ambulate  Hypomagnesemia Corrected w/ supplementation -continue to supplement to goal of 2.0  Hypokalemia  Supplement to goal of 4.0 -suspect significant total body deficit given long history of severe alcoholism  Macrocytic anemia  Due to alcohol abuse - Hgb stable   Essential HTN Exacerbated acutely by severe agitation - continue clonidine - avoid overaggressive correction until withdrawal improved further -with resolution of withdrawal blood pressure becoming much more stable  Anxiety disorder Appears well controlled at this time status post Ativan taper  Overweight  - Body mass index is 29.72 kg/m.   Disposition Ativan nearly completed completed -continue clonidine taper -mobilize -monitor intake -possible  discharge home 24-48 hours  Family Communication:  Status is: Inpatient  Remains inpatient appropriate because:Inpatient level of care appropriate due to severity of illness   Dispo: The patient is from: Home              Anticipated d/c is to: Home              Patient currently is not medically stable to d/c.   Difficult to place patient No    Objective: Blood pressure (!) 143/91, pulse 68, temperature 98.8 F (37.1 C), temperature source Oral, resp. rate 18, height 6\' 5"  (1.956 m), weight 113.7 kg, SpO2 98 %.  Intake/Output Summary (Last 24 hours) at 11/22/2020 0847 Last data filed at 11/21/2020 2139 Gross per 24 hour  Intake 240 ml  Output 2000 ml  Net -1760 ml   Filed Weights   11/19/20 0956 11/19/20 1809  Weight: 113.4 kg 113.7 kg    Examination: General: Alert and oriented -no respiratory distress Lungs: Clear to auscultation bilaterally without wheezing Cardiovascular: RRR without murmur Abdomen: NT/ND, soft, bs+, no mass  Extremities: No edema bilateral lower extremities  CBC: Recent Labs  Lab 11/19/20 1016 11/19/20 2105 11/20/20 0310 11/21/20 0256 11/22/20 0507  WBC 5.5   < > 5.0 5.2 8.4  NEUTROABS 3.9  --   --   --   --   HGB 12.4*   < > 13.6 11.3* 13.0  HCT 36.4*   < > 40.3 34.5* 38.6*  MCV 99.2   < > 100.2* 102.4* 101.0*  PLT 176   < > 152 169 200   < > = values in this interval not displayed.   Basic Metabolic Panel:  Recent Labs  Lab 11/19/20 2105 11/20/20 0310 11/21/20 0256 11/22/20 0507  NA 141 139 138 135  K 3.8 3.6 3.4* 3.4*  CL 106 106 105 102  CO2 26 25 25 26   GLUCOSE 86 86 108* 129*  BUN <5* <5* 7 7  CREATININE 0.83 0.71 0.92 0.98  CALCIUM 9.3 8.9 8.7* 9.1  MG 1.6*  --  2.0 1.8  PHOS 3.9  --  3.6 4.0   GFR: Estimated Creatinine Clearance: 131.8 mL/min (by C-G formula based on SCr of 0.98 mg/dL).  Liver Function Tests: Recent Labs  Lab 11/19/20 2105 11/20/20 0310 11/21/20 0256 11/22/20 0507  AST 45* 38 31 29  ALT 44  40 33 32  ALKPHOS 38 34* 52 46  BILITOT 1.2 1.1 0.8 0.6  PROT 7.5 7.0 6.5 6.9  ALBUMIN 4.2 3.7 3.5 3.8    HbA1C: Hgb A1c MFr Bld  Date/Time Value Ref Range Status  03/23/2019 10:00 AM 5.3 <5.7 % of total Hgb Final    Comment:    For the purpose of screening for the presence of diabetes: . <5.7%       Consistent with the absence of diabetes 5.7-6.4%    Consistent with increased risk for diabetes             (prediabetes) > or =6.5%  Consistent with diabetes . This assay result is consistent with a decreased risk of diabetes. . Currently, no consensus exists regarding use of hemoglobin A1c for diagnosis of diabetes in children. . According to American Diabetes Association (ADA) guidelines, hemoglobin A1c <7.0% represents optimal control in non-pregnant diabetic patients. Different metrics may apply to specific patient populations.  Standards of Medical Care in Diabetes(ADA). 03/25/2019   08/27/2018 03:29 PM 5.4 <5.7 % of total Hgb Final    Comment:    For the purpose of screening for the presence of diabetes: . <5.7%       Consistent with the absence of diabetes 5.7-6.4%    Consistent with increased risk for diabetes             (prediabetes) > or =6.5%  Consistent with diabetes . This assay result is consistent with a decreased risk of diabetes. . Currently, no consensus exists regarding use of hemoglobin A1c for diagnosis of diabetes in children. . According to American Diabetes Association (ADA) guidelines, hemoglobin A1c <7.0% represents optimal control in non-pregnant diabetic patients. Different metrics may apply to specific patient populations.  Standards of Medical Care in Diabetes(ADA). 08/29/2018      Recent Results (from the past 240 hour(s))  Resp Panel by RT-PCR (Flu A&B, Covid) Nasopharyngeal Swab     Status: None   Collection Time: 11/19/20 12:20 PM   Specimen: Nasopharyngeal Swab; Nasopharyngeal(NP) swabs in vial transport medium  Result Value Ref Range  Status   SARS Coronavirus 2 by RT PCR NEGATIVE NEGATIVE Final    Comment: (NOTE) SARS-CoV-2 target nucleic acids are NOT DETECTED.  The SARS-CoV-2 RNA is generally detectable in upper respiratory specimens during the acute phase of infection. The lowest concentration of SARS-CoV-2 viral copies this assay can detect is 138 copies/mL. A negative result does not preclude SARS-Cov-2 infection and should not be used as the sole basis for treatment or other patient management decisions. A negative result may occur with  improper specimen collection/handling, submission of specimen other than nasopharyngeal swab, presence of viral mutation(s) within the areas targeted by this assay, and inadequate number of viral copies(<138 copies/mL). A negative result must be  combined with clinical observations, patient history, and epidemiological information. The expected result is Negative.  Fact Sheet for Patients:  BloggerCourse.com  Fact Sheet for Healthcare Providers:  SeriousBroker.it  This test is no t yet approved or cleared by the [place] FDA and  has been authorized for detection and/or diagnosis of SARS-CoV-2 by FDA under an Emergency Use Authorization (EUA). This EUA will remain  in effect (meaning this test can be used) for the duration of the COVID-19 declaration under Section 564(b)(1) of the Act, 21 U.S.C.section 360bbb-3(b)(1), unless the authorization is terminated  or revoked sooner.       Influenza A by PCR NEGATIVE NEGATIVE Final   Influenza B by PCR NEGATIVE NEGATIVE Final    Comment: (NOTE) The Xpert Xpress SARS-CoV-2/FLU/RSV plus assay is intended as an aid in the diagnosis of influenza from Nasopharyngeal swab specimens and should not be used as a sole basis for treatment. Nasal washings and aspirates are unacceptable for Xpert Xpress SARS-CoV-2/FLU/RSV testing.  Fact Sheet for  Patients: BloggerCourse.com  Fact Sheet for Healthcare Providers: SeriousBroker.it  This test is not yet approved or cleared by the [place] FDA and has been authorized for detection and/or diagnosis of SARS-CoV-2 by FDA under an Emergency Use Authorization (EUA). This EUA will remain in effect (meaning this test can be used) for the duration of the COVID-19 declaration under Section 564(b)(1) of the Act, 21 U.S.C. section 360bbb-3(b)(1), unless the authorization is terminated or revoked.  Performed at [facility], [address] [place], [place] [address]      Scheduled Meds: . Chlorhexidine Gluconate Cloth  6 each Topical Daily  . cloNIDine  0.1 mg Oral BH-qamhs   Followed by  . [START ON 11/24/2020] cloNIDine  0.1 mg Oral QAC breakfast  . enoxaparin (LOVENOX) injection  40 mg Subcutaneous Q24H  . folic acid  1 mg Oral Daily  . irbesartan  300 mg Oral Daily  . minoxidil  10 mg Oral BID  . multivitamin with minerals  1 tablet Oral Daily  . nicotine  21 mg Transdermal Daily  . sertraline  25 mg Oral Daily  . thiamine  100 mg Oral Daily     LOS: 3 days   Lonia Blood, MD [facility] Office  [phone] Pager - Text Page per Amion  If 7PM-7AM, please contact night-coverage per Amion 11/22/2020, 8:47 AM

## 2020-11-22 NOTE — TOC Initial Note (Signed)
Transition of Care [facility]) - Initial/Assessment Note    Patient Details  Name: Cameron Donovan MRN: [id number] Date of Birth: [birth year]  Transition of [facility]) CM/SW Contact:    Lanier Clam, RN Phone Number: 11/22/2020, 12:41 PM  Clinical Narrative: [name] Monday to dtr about d/c plans-return home;agree to SA resources-provided on d/c instructions;dtr Lyndsay says she can afford meds if on $4 med list-she is workin [employer].Has transportation home. No further CM needs.                  Expected Discharge Plan: Home/Self Care Barriers to Discharge: Continued Medical Work up   Patient Goals and CMS Choice Patient states their goals for this hospitalization and ongoing recovery are:: go home CMS Medicare.gov Compare Post Acute Care list provided to:: Patient Represenative (must comment) (dtr Lyndsay [phone]) Choice offered to / list presented to : Adult Children  Expected Discharge Plan and Services Expected Discharge Plan: Home/Self Care   Discharge Planning Services: CM Consult   Living arrangements for the past 2 months: Single Family Home                                      Prior Living Arrangements/Services Living arrangements for the past 2 months: Single Family Home Lives with:: Self Patient language and need for interpreter reviewed:: Yes Do you feel safe going back to the place where you live?: Yes      Need for Family Participation in Patient Care: No (Comment) Care giver support system in place?: Yes (comment)   Criminal Activity/Legal Involvement Pertinent to Current Situation/Hospitalization: No - Comment as needed  Activities of Daily Living Home Assistive Devices/Equipment: None ADL Screening (condition at time of admission) Patient's cognitive ability adequate to safely complete daily activities?: Yes Is the patient deaf or have difficulty hearing?: No Does the patient have difficulty seeing, even when wearing glasses/contacts?: No Does  the patient have difficulty concentrating, remembering, or making decisions?: Yes Patient able to express need for assistance with ADLs?: Yes Does the patient have difficulty dressing or bathing?: No Independently performs ADLs?: Yes (appropriate for developmental age) Does the patient have difficulty walking or climbing stairs?: Yes Weakness of Legs: Right Weakness of Arms/Hands: None  Permission Sought/Granted Permission sought to share information with : Case Manager Permission granted to share information with : Yes, Verbal Permission Granted  Share Information with NAME: Case Manager     Permission granted to share info w Relationship: Lyndsay dtr [phone]     Emotional Assessment Appearance:: Appears stated age Attitude/Demeanor/Rapport: Gracious Affect (typically observed): Accepting Orientation: : Oriented to Self,Oriented to  Time,Oriented to Situation Alcohol / Substance Use: Alcohol Use,Illicit Drugs Psych Involvement: No (comment)  Admission diagnosis:  Alcohol withdrawal (HCC) [F10.239] Alcohol withdrawal syndrome, with delirium (HCC) [F10.231] Patient Active Problem List   Diagnosis Date Noted  . Alcohol withdrawal (HCC) 11/19/2020  . Hypomagnesemia 11/19/2020  . Fatty liver 05/02/2018  . Obesity (BMI 30.0-34.9) 04/23/2018  . Hyperlipidemia 12/01/2013  . Other abnormal glucose 12/01/2013  . Medication management 12/01/2013  . Anxiety   . Testosterone Deficiency   . Vitamin D deficiency   . Thyroiditis   . Essential hypertension   . ADD (attention deficit disorder)    PCP:  Pcp, No Pharmacy:   [facility] [place] - [address] [place] [address] [place] [address] [place]  [place] [address] Phone: [phone] Fax: [phone]     Social Determinants of Health (SDOH) Interventions    Readmission Risk Interventions No flowsheet data found.

## 2020-11-22 NOTE — Discharge Instructions (Signed)
Outpatient Substance Use Treatment Services   [facility] [address] [place], [place] [address]  [phone] Private insurance, Medicare A&B, and GCCN   [facility]  [address]  [place], [place] [address] [phone] Medicaid, Self Pay   [facility]      [address]  [place], [place] [phone] Medicaid and Private Insurance, Self Pay   [facility] [address]  [place], [place]  [phone] Private Insurance, and Self Pay  [facility]      [address]    [place], [place] [address]  [phone] or [phone] Private Insurance Only                 [facility] [address]  [place], [place] [address] [phone] Medicaid, Medicare, Private Insurance  [facility] [address]  [place], [place] [address] [phone] Services are free or reduced  [facility]  [address] [phone]  Self Pay only, sliding scale  [facility]  [address]  [place], [place] [address] [phone] ([facility]) Self Pay, Medicaid Only   [facility] [address]  [place], [place] [address] [phone] Medicaid, Medicare, Private Insurance                     Adolescent Substance Use Treatment Services    [facility] [address]  [place], [place]  [phone] Self Pay Offer scholarships from the [employer] to help pay for treatment  Website: www.[facility]insight[facility].com  [facility] Males ages: 12-17 Adolescent Substance use Program Females: 12-17  [place] [facility] [address]  [place], [place] [address] (ph) [phone]  (fax) [phone]  [place]  Office  [address]  [place], [place] [address] (ph) [phone]  (fax) [phone]  [place] [facility] [address]  [place], [place] [address] (ph) [phone]  (fax) [phone]  [place] Office [address], [place], [place] [address] (ph) [phone]   (fax) [phone]  Website: [email or web address].[email or web address]/         [facility] Abuse Intensive Outpatient Program for Adolescents Phone: [phone] Address: [address], [place], [place] Website: [email or web address]    Residential Substance Use Treatment Services   [facility]  [address]  [place], [place] [address]  [phone] or [phone] Detox (Medicare, Medicaid, private insurance, and self pay)  Residential Rehab 14 days (Medicare, Medicaid, private insurance, and self pay)   [facility]  [address] , [place]  [phone]  Male and Male Detox (Self Pay and Medicaid limited availability)  Rehab only Male (Medicaid and self pay only)   [facility]      [address]  , [place] [address]  [phone] or [phone] Detox and Residential Treatment Private Insurance Only   [facility]  [address]  [place], [place] [address]  [phone]  Treatment Only, must make assessment appointment, and must be sober for assessment appointment.  Self Pay Only, Medicare A&B, [place] Medicaid, [place] ID only! *Transportation assistance offered from [employer] [address]  [facility]     [address] [place], [place] [address] Walk in interviews M-Sat 8-4p No   pending legal charges [phone]     [facility]:  [facility] Referral  [address] [place], [place] [phone] (Self Pay, Medicaid)  [facility] [address] [place], [place] [address] [phone] Detox and Residential Treatment Medicare and Private Insurance  [facility] [address]  [place], [place] [address] 28 Day Women's Facility: [phone] 28 Day Men's Facility: [phone] Long-term Residential Program:  [phone] Males 25 and Over (No Insurance, upfront fee)  [facility]  [address] [place], [place] [address] [phone] Private Insurance with [employer], Private Pay  [facility] [address] [place], [place] [address] Local [phone] Private Insurance Only  [facility] [address]  [place], [place] [address]  [phone] (Males, upfront fee)  [facility] [place] [address]  [place], [address] [phone] Private Insurance      [facility] Locations  [facility]  [address]  [place], [place]  [phone] Christian Based Program for individuals experiencing homelessness Self Pay, No insurance  [facility]  Men's program: [facility] [address]  [place], [place] [address] [phone]  [facility] Women's [facility]: [facility] [address] [place], [place] [address] [phone] Christian Based Program for individuals experiencing homelessness Self Pay, No insurance  [facility] [address]  [place], [place] [address]  [phone] Christian Based Program for individuals experiencing homelessness Self Pay, No insurance  [facility] [address]  ,  [address] [phone] Christian Based Program for individuals experiencing homelessness Self Pay, No insurance  [facility] [address] [place],  [phone] Christian Based Program for males experiencing homelessness Self Pay, No insurance 

## 2020-11-23 LAB — BASIC METABOLIC PANEL
Anion gap: 11 (ref 5–15)
BUN: 7 mg/dL (ref 6–20)
CO2: 22 mmol/L (ref 22–32)
Calcium: 9 mg/dL (ref 8.9–10.3)
Chloride: 101 mmol/L (ref 98–111)
Creatinine, Ser: 0.88 mg/dL (ref 0.61–1.24)
GFR, Estimated: >60 mL/min (ref >60)
Glucose, Bld: 100 mg/dL — ABNORMAL HIGH (ref 70–99)
Potassium: 4.1 mmol/L (ref 3.5–5.1)
Sodium: 134 mmol/L — ABNORMAL LOW (ref 135–145)

## 2020-11-23 LAB — MAGNESIUM: Magnesium: 1.9 mg/dL (ref 1.7–2.4)

## 2020-11-23 MED ORDER — NALOXONE HCL 0.4 MG/ML IJ SOLN
INTRAMUSCULAR | Status: AC
  Filled 2020-11-23: qty 1

## 2020-11-23 MED ORDER — LISINOPRIL 40 MG PO TABS: 40.0000 mg | ORAL_TABLET | Freq: Every day | ORAL | 3 refills | Status: AC

## 2020-11-23 NOTE — Plan of Care (Signed)
  Problem: Education: Goal: Knowledge of General Education information will improve Description: Including pain rating scale, medication(s)/side effects and non-pharmacologic comfort measures Outcome: Completed/Met   Problem: Activity: Goal: Risk for activity intolerance will decrease Outcome: Completed/Met   Problem: Nutrition: Goal: Adequate nutrition will be maintained Outcome: Completed/Met   Problem: Coping: Goal: Level of anxiety will decrease Outcome: Completed/Met

## 2020-11-23 NOTE — Discharge Summary (Signed)
Physician Discharge Summary   [place] ChingJames Lubitz ZOX:[id number] DOB: 07/05/1976 DOA: 11/19/2020  PCP: Oneita HurtPcp, No  Admit date: 11/19/2020 Discharge date: 11/23/2020   Admitted From: home Disposition:  home Discharging physician: Lewie Chamberavid Eligah Anello, MD  Recommendations for Outpatient Follow-up:  1. Started back on Lisinopril for HTN; may need further meds depending on response; patient had stopped taking all meds previously    Patient discharged to home in Discharge Condition: stable Risk of unplanned readmission score: Unplanned Admission- Pilot do not use: 8.47  CODE STATUS: Full Diet recommendation:  Diet Orders (From admission, onward)    Start     Ordered   11/23/20 0000  Diet general        11/23/20 1053          Hospital Course: Mr. Randa Evensdwards is a 45 year old male with PMH hypertension, anxiety, ADD, alcohol abuse, hypogonadism who presented to the hospital in severe acute alcohol withdrawal.  He stopped drinking cold [place] and wished to continue detox.  He was started on CIWA protocol and supported through remainder of alcohol withdrawal.  His agitation and tremors dramatically improved with ongoing hospitalization. His blood pressure remained mildly elevated and he was continued on lisinopril at discharge.  He stated that he had stopped taking all of his medications a long time ago.  He plans to reestablish with a provider outpatient and continue on medications.  Lisinopril was sent to his pharmacy.   The patient's chronic medical conditions were treated accordingly per the patient's home medication regimen except as noted.  On day of discharge, patient was felt deemed stable for discharge. Patient/family member advised to call PCP or come back to ER if needed.   Principal Diagnosis: Alcohol withdrawal (HCC)  Discharge Diagnoses: Active Hospital Problems   Diagnosis Date Noted  . Alcohol withdrawal (HCC) 11/19/2020  . Hypomagnesemia 11/19/2020  . Hyperlipidemia 12/01/2013  .  Essential hypertension   . Anxiety   . ADD (attention deficit disorder)     Resolved Hospital Problems  No resolved problems to display.    Discharge Instructions    Diet general   Complete by: As directed    Increase activity slowly   Complete by: As directed      Allergies as of 11/23/2020   No Known Allergies     Medication List    STOP taking these medications   atenolol 50 MG tablet Commonly known as: Tenormin   chlordiazePOXIDE 25 MG capsule Commonly known as: LIBRIUM   ezetimibe 10 MG tablet Commonly known as: Zetia   minoxidil 10 MG tablet Commonly known as: LONITEN   olmesartan 40 MG tablet Commonly known as: BENICAR   rosuvastatin 40 MG tablet Commonly known as: Crestor   sertraline 25 MG tablet Commonly known as: Zoloft   sildenafil 100 MG tablet Commonly known as: VIAGRA     TAKE these medications   lisinopril 40 MG tablet Commonly known as: ZESTRIL Take 1 tablet (40 mg total) by mouth daily.       No Known Allergies   Discharge Exam: BP 115/69 (BP Location: Right Arm)   Pulse 69   Temp 99.1 F (37.3 C) (Oral)   Resp 15   Ht 6\' 5"  (1.956 m)   Wt 113.7 kg   SpO2 97%   BMI 29.72 kg/m  General appearance: alert, cooperative and no distress Head: Normocephalic, without obvious abnormality, atraumatic Eyes: EOMI Lungs: clear to auscultation bilaterally Heart: regular rate and rhythm and S1, S2 normal Abdomen: normal findings: bowel sounds  normal and soft, non-tender Extremities: No edema Skin: mobility and turgor normal Neurologic: Grossly normal  The results of significant diagnostics from this hospitalization (including imaging, microbiology, ancillary and laboratory) are listed below for reference.   Microbiology: Recent Results (from the past 240 hour(s))  Resp Panel by RT-PCR (Flu A&B, Covid) Nasopharyngeal Swab     Status: None   Collection Time: 11/19/20 12:20 PM   Specimen: Nasopharyngeal Swab; Nasopharyngeal(NP) swabs  in vial transport medium  Result Value Ref Range Status   SARS Coronavirus 2 by RT PCR NEGATIVE NEGATIVE Final    Comment: (NOTE) SARS-CoV-2 target nucleic acids are NOT DETECTED.  The SARS-CoV-2 RNA is generally detectable in upper respiratory specimens during the acute phase of infection. The lowest concentration of SARS-CoV-2 viral copies this assay can detect is 138 copies/mL. A negative result does not preclude SARS-Cov-2 infection and should not be used as the sole basis for treatment or other patient management decisions. A negative result may occur with  improper specimen collection/handling, submission of specimen other than nasopharyngeal swab, presence of viral mutation(s) within the areas targeted by this assay, and inadequate number of viral copies(<138 copies/mL). A negative result must be combined with clinical observations, patient history, and epidemiological information. The expected result is Negative.  Fact Sheet for Patients:  BloggerCourse.com  Fact Sheet for Healthcare Providers:  SeriousBroker.it  This test is no t yet approved or cleared by the [place] FDA and  has been authorized for detection and/or diagnosis of SARS-CoV-2 by FDA under an Emergency Use Authorization (EUA). This EUA will remain  in effect (meaning this test can be used) for the duration of the COVID-19 declaration under Section 564(b)(1) of the Act, 21 U.S.C.section 360bbb-3(b)(1), unless the authorization is terminated  or revoked sooner.       Influenza A by PCR NEGATIVE NEGATIVE Final   Influenza B by PCR NEGATIVE NEGATIVE Final    Comment: (NOTE) The Xpert Xpress SARS-CoV-2/FLU/RSV plus assay is intended as an aid in the diagnosis of influenza from Nasopharyngeal swab specimens and should not be used as a sole basis for treatment. Nasal washings and aspirates are unacceptable for Xpert Xpress  SARS-CoV-2/FLU/RSV testing.  Fact Sheet for Patients: BloggerCourse.com  Fact Sheet for Healthcare Providers: SeriousBroker.it  This test is not yet approved or cleared by the [place] FDA and has been authorized for detection and/or diagnosis of SARS-CoV-2 by FDA under an Emergency Use Authorization (EUA). This EUA will remain in effect (meaning this test can be used) for the duration of the COVID-19 declaration under Section 564(b)(1) of the Act, 21 U.S.C. section 360bbb-3(b)(1), unless the authorization is terminated or revoked.  Performed at [facility], [address] [place], [place] [address]      Labs: BNP (last 3 results) No results for input(s): BNP in the last 8760 hours. Basic Metabolic Panel: Recent Labs  Lab 11/19/20 2105 11/20/20 0310 11/21/20 0256 11/22/20 0507 11/23/20 0428  NA 141 139 138 135 134*  K 3.8 3.6 3.4* 3.4* 4.1  CL 106 106 105 102 101  CO2 26 25 25 26 22   GLUCOSE 86 86 108* 129* 100*  BUN <5* <5* 7 7 7   CREATININE 0.83 0.71 0.92 0.98 0.88  CALCIUM 9.3 8.9 8.7* 9.1 9.0  MG 1.6*  --  2.0 1.8 1.9  PHOS 3.9  --  3.6 4.0  --    Liver Function Tests: Recent Labs  Lab 11/19/20 1016 11/19/20 2105 11/20/20 0310 11/21/20  0256 11/22/20 0507  AST 47* 45* 38 31 29  ALT 45* 44 40 33 32  ALKPHOS 38 38 34* 52 46  BILITOT 0.6 1.2 1.1 0.8 0.6  PROT 7.3 7.5 7.0 6.5 6.9  ALBUMIN 4.0 4.2 3.7 3.5 3.8   No results for input(s): LIPASE, AMYLASE in the last 168 hours. No results for input(s): AMMONIA in the last 168 hours. CBC: Recent Labs  Lab 11/19/20 1016 11/19/20 2105 11/20/20 0310 11/21/20 0256 11/22/20 0507  WBC 5.5 6.0 5.0 5.2 8.4  NEUTROABS 3.9  --   --   --   --   HGB 12.4* 12.8* 13.6 11.3* 13.0  HCT 36.4* 38.4* 40.3 34.5* 38.6*  MCV 99.2 102.1* 100.2* 102.4* 101.0*  PLT 176 160 152 169 200   Cardiac Enzymes: No results for input(s): CKTOTAL, CKMB, CKMBINDEX,  TROPONINI in the last 168 hours. BNP: Invalid input(s): POCBNP CBG: No results for input(s): GLUCAP in the last 168 hours. D-Dimer No results for input(s): DDIMER in the last 72 hours. Hgb A1c No results for input(s): HGBA1C in the last 72 hours. Lipid Profile No results for input(s): CHOL, HDL, LDLCALC, TRIG, CHOLHDL, LDLDIRECT in the last 72 hours. Thyroid function studies No results for input(s): TSH, T4TOTAL, T3FREE, THYROIDAB in the last 72 hours.  Invalid input(s): FREET3 Anemia work up No results for input(s): VITAMINB12, FOLATE, FERRITIN, TIBC, IRON, RETICCTPCT in the last 72 hours. Urinalysis    Component Value Date/Time   COLORURINE YELLOW 03/23/2019 1000   APPEARANCEUR CLEAR 03/23/2019 1000   LABSPEC 1.015 03/23/2019 1000   PHURINE 6.0 03/23/2019 1000   GLUCOSEU NEGATIVE 03/23/2019 1000   HGBUR NEGATIVE 03/23/2019 1000   BILIRUBINUR NEGATIVE 01/10/2017 0936   KETONESUR NEGATIVE 03/23/2019 1000   PROTEINUR NEGATIVE 03/23/2019 1000   NITRITE NEGATIVE 03/23/2019 1000   LEUKOCYTESUR NEGATIVE 03/23/2019 1000   Sepsis Labs Invalid input(s): PROCALCITONIN,  WBC,  LACTICIDVEN Microbiology Recent Results (from the past 240 hour(s))  Resp Panel by RT-PCR (Flu A&B, Covid) Nasopharyngeal Swab     Status: None   Collection Time: 11/19/20 12:20 PM   Specimen: Nasopharyngeal Swab; Nasopharyngeal(NP) swabs in vial transport medium  Result Value Ref Range Status   SARS Coronavirus 2 by RT PCR NEGATIVE NEGATIVE Final    Comment: (NOTE) SARS-CoV-2 target nucleic acids are NOT DETECTED.  The SARS-CoV-2 RNA is generally detectable in upper respiratory specimens during the acute phase of infection. The lowest concentration of SARS-CoV-2 viral copies this assay can detect is 138 copies/mL. A negative result does not preclude SARS-Cov-2 infection and should not be used as the sole basis for treatment or other patient management decisions. A negative result may occur with   improper specimen collection/handling, submission of specimen other than nasopharyngeal swab, presence of viral mutation(s) within the areas targeted by this assay, and inadequate number of viral copies(<138 copies/mL). A negative result must be combined with clinical observations, patient history, and epidemiological information. The expected result is Negative.  Fact Sheet for Patients:  BloggerCourse.com  Fact Sheet for Healthcare Providers:  SeriousBroker.it  This test is no t yet approved or cleared by the [place] FDA and  has been authorized for detection and/or diagnosis of SARS-CoV-2 by FDA under an Emergency Use Authorization (EUA). This EUA will remain  in effect (meaning this test can be used) for the duration of the COVID-19 declaration under Section 564(b)(1) of the Act, 21 U.S.C.section 360bbb-3(b)(1), unless the authorization is terminated  or revoked sooner.  Influenza A by PCR NEGATIVE NEGATIVE Final   Influenza B by PCR NEGATIVE NEGATIVE Final    Comment: (NOTE) The Xpert Xpress SARS-CoV-2/FLU/RSV plus assay is intended as an aid in the diagnosis of influenza from Nasopharyngeal swab specimens and should not be used as a sole basis for treatment. Nasal washings and aspirates are unacceptable for Xpert Xpress SARS-CoV-2/FLU/RSV testing.  Fact Sheet for Patients: BloggerCourse.com  Fact Sheet for Healthcare Providers: SeriousBroker.it  This test is not yet approved or cleared by the [place] FDA and has been authorized for detection and/or diagnosis of SARS-CoV-2 by FDA under an Emergency Use Authorization (EUA). This EUA will remain in effect (meaning this test can be used) for the duration of the COVID-19 declaration under Section 564(b)(1) of the Act, 21 U.S.C. section 360bbb-3(b)(1), unless the authorization is terminated  or revoked.  Performed at [facility], [address] [place], [place] [address]     Procedures/Studies: DG Chest Portable 1 View  Result Date: 11/19/2020 CLINICAL DATA:  Desaturation. EXAM: PORTABLE CHEST 1 VIEW COMPARISON:  September 04, 2013 FINDINGS: The heart size and mediastinal contours are within normal limits. Both lungs are clear. The visualized skeletal structures are unremarkable. IMPRESSION: No active disease. Electronically Signed   By: Gerome Sam III M.D   On: 11/19/2020 13:17     Time coordinating discharge: Over 30 minutes    Lewie Chamber, MD  [facility] 11/23/2020, 6:05 PM

## 2020-11-23 NOTE — Progress Notes (Signed)
PT Cancellation Note / Screen  Patient Details Name: Cameron Donovan MRN: [id number] DOB: 11-Nov-1975   Cancelled Treatment:    Reason Eval/Treat Not Completed: PT screened, no needs identified, will sign off Pt dressed and awaiting d/c.  RN reports no PT needs at this time.  PT to sign off.   Virl Coble,KATHrine E 11/23/2020, 11:22 AM Paulino Door, DPT Acute Rehabilitation Services Pager: [phone] Office: [phone]

## 2020-11-23 NOTE — Progress Notes (Signed)
Reviewed all discharge instructions with pt. All questions answered regarding instructions. Pt verbalized understanding of all discharge instructions. Allayah Raineri, Yancey Flemings, RN

## 2021-04-19 ENCOUNTER — Encounter: Payer: Self-pay | Admitting: Internal Medicine

## 2022-06-27 IMAGING — DX DG CHEST 1V PORT
1 series · 1 of 1 positions shown · non-contrast
Comparison: September 04, 2013

CLINICAL DATA: Desaturation.

EXAM:
PORTABLE CHEST 1 VIEW

[chest ap]
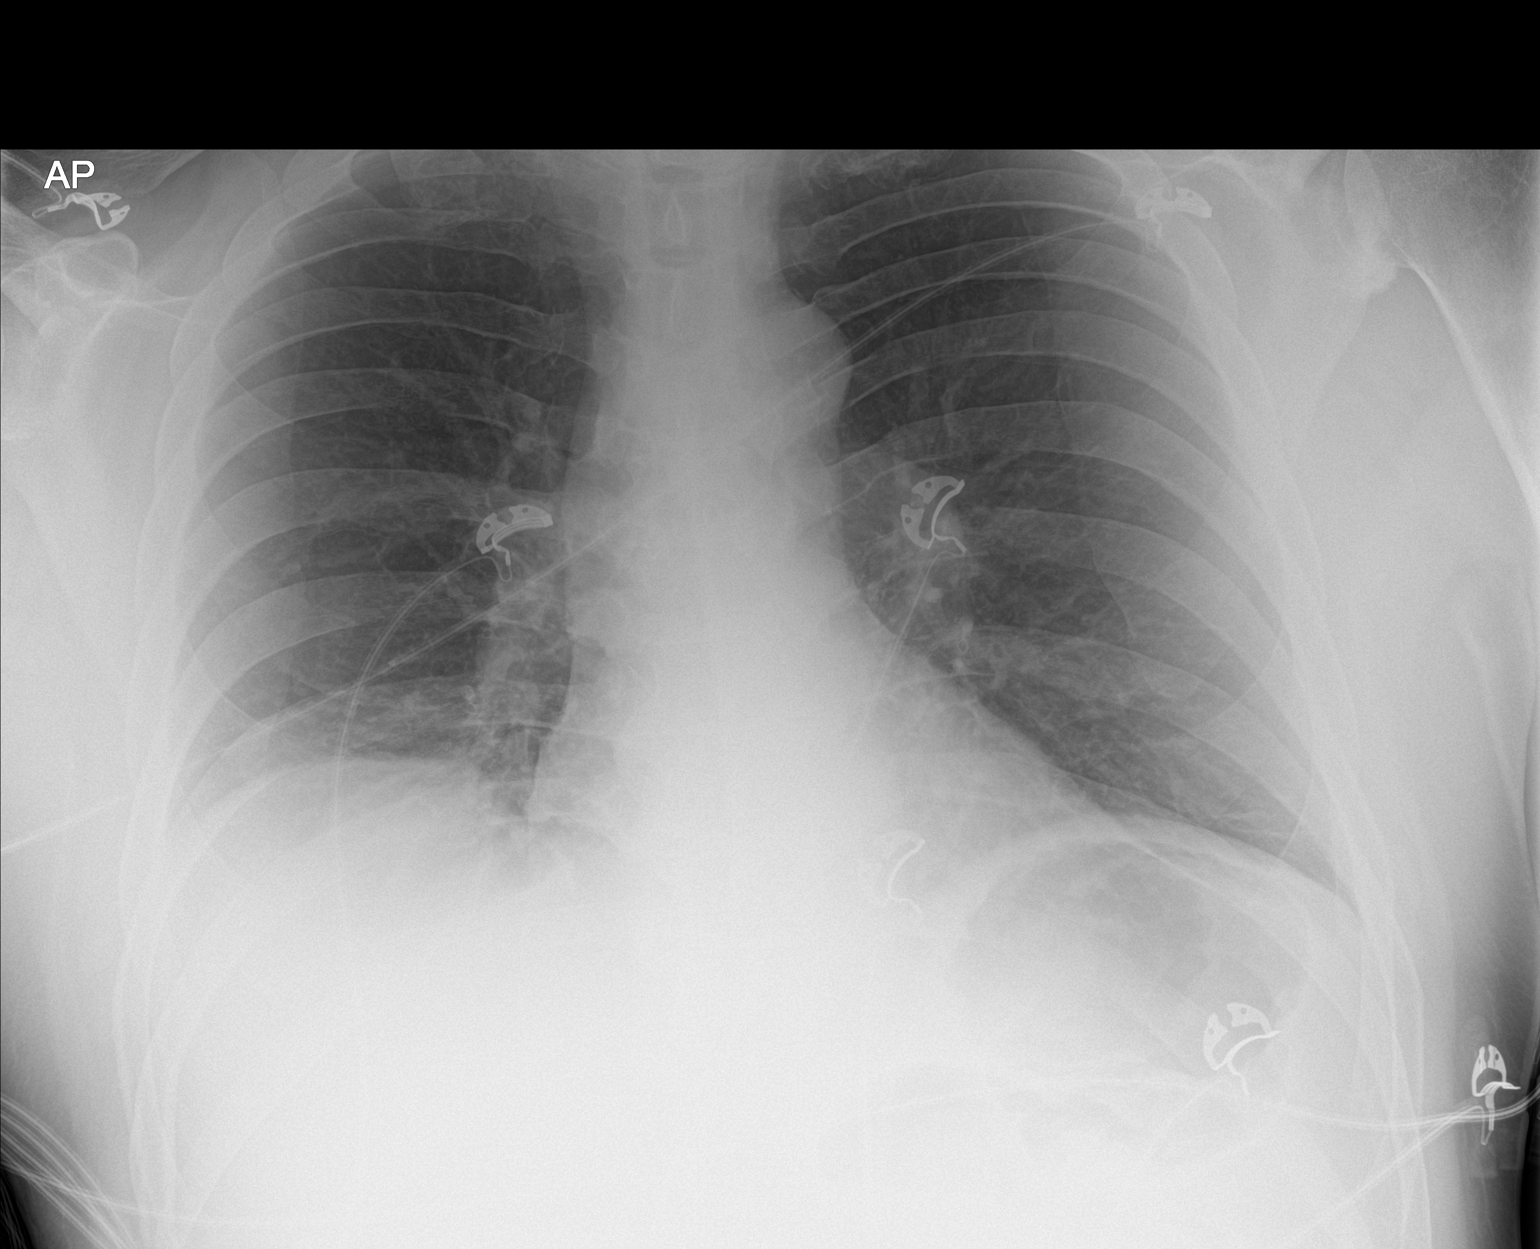

[1 of 1 positions shown; findings below may reference images not displayed]

FINDINGS: The heart size and mediastinal contours are within normal limits.
Both lungs are clear. The visualized skeletal structures are
unremarkable.
IMPRESSION: No active disease.

## 2023-10-03 ENCOUNTER — Encounter: Payer: Self-pay | Admitting: *Deleted
